# Patient Record
Sex: Female | Born: 1970 | Hispanic: No | Marital: Married | Smoking: Never smoker
Health system: Southern US, Community
[De-identification: ages and names within clinical notes are randomized; demographics above are authoritative.]

## PROBLEM LIST (undated history)

## (undated) DIAGNOSIS — D649 Anemia, unspecified: Secondary | ICD-10-CM

## (undated) DIAGNOSIS — D219 Benign neoplasm of connective and other soft tissue, unspecified: Secondary | ICD-10-CM

## (undated) DIAGNOSIS — S0500XA Injury of conjunctiva and corneal abrasion without foreign body, unspecified eye, initial encounter: Secondary | ICD-10-CM

## (undated) DIAGNOSIS — G5603 Carpal tunnel syndrome, bilateral upper limbs: Secondary | ICD-10-CM

## (undated) DIAGNOSIS — R140 Abdominal distension (gaseous): Secondary | ICD-10-CM

## (undated) HISTORY — PX: TUBAL LIGATION: SHX77

## (undated) HISTORY — DX: Benign neoplasm of connective and other soft tissue, unspecified: D21.9

## (undated) HISTORY — PX: ABDOMINAL HYSTERECTOMY: SHX81

## (undated) HISTORY — PX: HAND SURGERY: SHX662

---

## 1998-08-13 ENCOUNTER — Other Ambulatory Visit: Admission: RE | Admit: 1998-08-13 | Discharge: 1998-08-13 | Payer: Self-pay | Admitting: Obstetrics and Gynecology

## 1999-02-26 ENCOUNTER — Inpatient Hospital Stay (HOSPITAL_COMMUNITY): Admission: AD | Admit: 1999-02-26 | Discharge: 1999-02-27 | Payer: Self-pay | Admitting: *Deleted

## 1999-02-26 ENCOUNTER — Encounter (INDEPENDENT_AMBULATORY_CARE_PROVIDER_SITE_OTHER): Payer: Self-pay

## 1999-03-24 ENCOUNTER — Other Ambulatory Visit: Admission: RE | Admit: 1999-03-24 | Discharge: 1999-03-24 | Payer: Self-pay | Admitting: Obstetrics and Gynecology

## 2000-05-03 ENCOUNTER — Other Ambulatory Visit: Admission: RE | Admit: 2000-05-03 | Discharge: 2000-05-03 | Payer: Self-pay | Admitting: Obstetrics and Gynecology

## 2002-02-14 ENCOUNTER — Encounter: Payer: Self-pay | Admitting: Obstetrics & Gynecology

## 2002-02-14 ENCOUNTER — Encounter: Admission: RE | Admit: 2002-02-14 | Discharge: 2002-02-14 | Payer: Self-pay | Admitting: Obstetrics & Gynecology

## 2002-10-31 ENCOUNTER — Encounter: Admission: RE | Admit: 2002-10-31 | Discharge: 2002-10-31 | Payer: Self-pay | Admitting: Obstetrics and Gynecology

## 2002-10-31 ENCOUNTER — Encounter: Payer: Self-pay | Admitting: Obstetrics and Gynecology

## 2003-03-23 ENCOUNTER — Other Ambulatory Visit: Admission: RE | Admit: 2003-03-23 | Discharge: 2003-03-23 | Payer: Self-pay | Admitting: Obstetrics & Gynecology

## 2003-04-03 ENCOUNTER — Encounter: Admission: RE | Admit: 2003-04-03 | Discharge: 2003-04-03 | Payer: Self-pay | Admitting: Obstetrics & Gynecology

## 2005-07-27 ENCOUNTER — Encounter: Admission: RE | Admit: 2005-07-27 | Discharge: 2005-07-27 | Payer: Self-pay | Admitting: Internal Medicine

## 2005-08-24 ENCOUNTER — Encounter: Admission: RE | Admit: 2005-08-24 | Discharge: 2005-08-24 | Payer: Self-pay | Admitting: Internal Medicine

## 2005-10-03 ENCOUNTER — Encounter: Admission: RE | Admit: 2005-10-03 | Discharge: 2005-10-03 | Payer: Self-pay | Admitting: Internal Medicine

## 2006-08-24 ENCOUNTER — Ambulatory Visit (HOSPITAL_COMMUNITY): Admission: RE | Admit: 2006-08-24 | Discharge: 2006-08-24 | Payer: Self-pay | Admitting: Urology

## 2006-10-08 ENCOUNTER — Encounter (INDEPENDENT_AMBULATORY_CARE_PROVIDER_SITE_OTHER): Payer: Self-pay | Admitting: Urology

## 2006-10-08 ENCOUNTER — Ambulatory Visit (HOSPITAL_BASED_OUTPATIENT_CLINIC_OR_DEPARTMENT_OTHER): Admission: RE | Admit: 2006-10-08 | Discharge: 2006-10-08 | Payer: Self-pay | Admitting: Urology

## 2007-01-27 ENCOUNTER — Ambulatory Visit (HOSPITAL_COMMUNITY): Admission: RE | Admit: 2007-01-27 | Discharge: 2007-01-27 | Payer: Self-pay | Admitting: Urology

## 2010-04-03 ENCOUNTER — Encounter: Payer: Self-pay | Admitting: Internal Medicine

## 2010-07-26 NOTE — Op Note (Signed)
Sonya Gutierrez, Sonya Gutierrez                ACCOUNT NO.:  [id number]   MEDICAL RECORD NO.:  [id number]          PATIENT TYPE:  AMB   LOCATION:  [facility]                         FACILITY:  [facility]   PHYSICIAN:  Sigmund I. Patsi Sears, M.D.DATE OF BIRTH:  Jun 21, 1970   DATE OF PROCEDURE:  10/08/2006  DATE OF DISCHARGE:                               OPERATIVE REPORT   PREOPERATIVE DIAGNOSIS:  Urethral diverticulum.   POSTOPERATIVE DIAGNOSIS:  Infected urethral diverticulum.   OPERATION:  Cystourethroscopy with eccentric right urethral  diverticulectomy, with cultures of urethral pus.   SURGEON:  Sigmund I. Patsi Sears, M.D.   ANESTHESIA:  General LMA.   PREPARATION:  After appropriate preanesthesia, the patient is brought to  the operating room, placed on the operating table in the dorsal supine  position where general LMA anesthesia was induced.  The patient was then  replaced in dorsal lithotomy position where pubis was prepped with  Betadine solution and draped in the usual fashion.   REVIEW OF HISTORY:  This 40 year old Spanish-speaking only female was  referred for evaluation of recurrent urinary tract infections, with a  suggestion of urethral diverticulum present.  MRI documented the  eccentric right-sided diverticulum, and the diverticula appeared to  contain some concretions.  She is now for diverticulectomy.   PROCEDURE:  Cystourethroscopy again revealed some mild granular  trigonitis with normal trigone, and clear reflux of both orifices.  The  urethra itself appears normal, and I do not see the diverticular opening  in the urethra.  The diverticulum itself appears to be very distal, from  the mid urethra to the very distal urethra.   Indigo carmine is injected in the urethra in hopes of pushing some  indigo carmine into the diverticulum.   The diverticulum itself appeared to be approximately 1 cm in diameter,  and presented as a hard, pea-sized nodule, beginning in the midline,  and  then accentuating in the spherical manner on the right side of the  urethra, and proximal ward.   It was elected to perform an inverted U and the inverted U incision, so  that I would have good the have good  vaginal covered tissue.  After  injection with Marcaine 0.25 plain in the periurethral tissues, a U-  shaped incision was made across the distal third of the diverticulum,  and then proximalward to the mid vagina.  Subcutaneous tissue was  dissected.  A tongue was developed, and beneath this, the diverticulum  was identified.  The diverticulum was carefully dissected with both  sharp and blunt dissection.  It appeared to have a very narrow neck at  the level of the mid urethra, and part of the diverticulum extended  proximally.  This gave the diverticulum a tear shape.  It was completely  excised, sent to laboratory for evaluation.  Pus was noted to come from  the diverticulum, and this was cultured, both anaerobic and aerobic  cultures.  The patient was covered with IV Ancef for the procedure.   Cystoscopy again was accomplished, again I could not find the neck of  the diverticulum.  There was no  blue contrast, though there was a  suggestion of a small pinhole urethra of leakage with urethral fluid.  The bladder was drained of fluid with the Foley catheter.  It was  elected to leave  Was elected to leave a Foley catheter in place and  cover the urethra with the previously dissected.  Antibiotic irrigation  was used in copious amounts.  The incision was closed in two layers, by  closing an internal layer with 3-0 Monocryl suture, interrupted and  running, and the vaginal epithelium was closed with two separate running  Monocryl sutures from the 12 o'clock to 3 o'clock position, and from the  12 o'clock to the 9 o'clock positions in the vagina.  The patient  tolerated the procedure well.  No bleeding was noted and the vaginal  packing was placed without difficulty.  The patient  will stay for 23  hours, vaginal packing removed in the a.m.Lynelle Smoke I. Patsi Sears, M.D.  Electronically Signed     SIT/MEDQ  D:  10/08/2006  T:  10/09/2006  Job:  [id number]

## 2010-07-29 NOTE — Discharge Summary (Signed)
[facility]  Patient:    Sonya Gutierrez                          MRN: [id number] Adm. Date:  [id number] Disc. Date: [id number] Attending:  Ardeen Fillers                           Discharge Summary  DISCHARGE DIAGNOSES:          1. Intrauterine pregnancy at term, delivered.                               2. Rh positive.                               3. Group B Strep positive.                               4. Request for sterilization.  PROCEDURE:                    Vacuum-assisted vaginal delivery, repair of second degree perineal laceration on February 26, 1999, and modified Pomeroy bilateral  tubal ligation on February 26, 1999.  HISTORY OF PRESENT ILLNESS:   A 40 year old Hispanic woman, gravida 4, para 3, DC February 24, 1999, by dates and midsecond trimester ultrasound, admitted from maternity admissions unit in active labor at 8 cm.  HOSPITAL COURSE:              Membranes ruptured spontaneously.  The patient was given penicillin-G prophylaxis because of known Group B Strep positive vaginal culture, late in the third trimester.  Second stage was nine minutes.  The patient was consented for vacuum delivery because of bradycardia.  Vacuum was applied at +3 station.  The baby was in OA position and the baby was delivered in two contractions.  A live female, 3660 grams, with Apgars of 8 and 9 over an intact  perineum was delivered.  Estimated blood loss was 400 cc. Second degree perineal laceration was repaired without difficulty.  Arterial cord pH was 7.24.  The patient had requested a postpartum tubal.  This was well documented in the chart.  She had been consented preoperatively and consent was again given.  She was given literature in Spanish as well.  She was taken to the operating room where she underwent an uneventful modified Pomeroy tubal ligation.  Pathology late revealed complete transections of portions of each tube.  The  remainder of the postpartum course was unremarkable.  She was discharged to  home on postpartum day #1 in satisfactory condition.  Hemoglobin stabilized at 12.0.  Routine status post vaginal delivery instructions were given.  DISCHARGE MEDICATIONS:        1. Prenatal vitamins.                               2. Tylox tablets.  FOLLOW-UP:                    She will be followed up by the [facility] in four to six weeks. DD:  04/02/99 TD:  04/05/99 Job: [id number]

## 2010-12-26 LAB — ANAEROBIC CULTURE: Gram Stain: NONE SEEN

## 2010-12-26 LAB — WOUND CULTURE: Gram Stain: NONE SEEN

## 2013-05-14 ENCOUNTER — Ambulatory Visit: Payer: Self-pay

## 2013-05-17 ENCOUNTER — Encounter (HOSPITAL_COMMUNITY): Payer: Self-pay | Admitting: Emergency Medicine

## 2013-05-17 ENCOUNTER — Emergency Department (HOSPITAL_COMMUNITY)
Admission: EM | Admit: 2013-05-17 | Discharge: 2013-05-17 | Disposition: A | Discharge status: Home / Self Care | Payer: No Typology Code available for payment source | Attending: Emergency Medicine | Admitting: Emergency Medicine

## 2013-05-17 DIAGNOSIS — S058X9A Other injuries of unspecified eye and orbit, initial encounter: Secondary | ICD-10-CM | POA: Insufficient documentation

## 2013-05-17 DIAGNOSIS — IMO0002 Reserved for concepts with insufficient information to code with codable children: Secondary | ICD-10-CM | POA: Insufficient documentation

## 2013-05-17 DIAGNOSIS — Y9389 Activity, other specified: Secondary | ICD-10-CM | POA: Insufficient documentation

## 2013-05-17 DIAGNOSIS — S0500XA Injury of conjunctiva and corneal abrasion without foreign body, unspecified eye, initial encounter: Secondary | ICD-10-CM

## 2013-05-17 DIAGNOSIS — Y929 Unspecified place or not applicable: Secondary | ICD-10-CM | POA: Insufficient documentation

## 2013-05-17 MED ORDER — HYDROCODONE-ACETAMINOPHEN 5-325 MG PO TABS
2.0000 | ORAL_TABLET | Freq: Once | ORAL | Status: AC
  Administered 2013-05-17: 2 via ORAL
  Filled 2013-05-17: qty 2

## 2013-05-17 MED ORDER — POLYMYXIN B-TRIMETHOPRIM 10000-0.1 UNIT/ML-% OP SOLN: 2.0000 [drp] | OPHTHALMIC | Status: DC

## 2013-05-17 MED ORDER — TETRACAINE HCL 0.5 % OP SOLN
1.0000 [drp] | Freq: Once | OPHTHALMIC | Status: AC
  Administered 2013-05-17: 1 [drp] via OPHTHALMIC
  Filled 2013-05-17: qty 2

## 2013-05-17 MED ORDER — FLUORESCEIN SODIUM 1 MG OP STRP
1.0000 | ORAL_STRIP | Freq: Once | OPHTHALMIC | Status: AC
  Administered 2013-05-17: 1 via OPHTHALMIC
  Filled 2013-05-17: qty 1

## 2013-05-17 MED ORDER — HYDROCODONE-ACETAMINOPHEN 5-325 MG PO TABS: 2.0000 | ORAL_TABLET | ORAL | Status: DC | PRN

## 2013-05-17 NOTE — ED Notes (Signed)
Pt presents to department for evaluation of L eye pain. States she was using cosmetic tweezers when they slipped and struck eye yesterday. Now states pain and blurred vision. Pupils equal and reactive bilaterally. Pt is alert and oriented x4.

## 2013-05-17 NOTE — ED Provider Notes (Signed)
CSN: [id number]     Arrival date & time 05/17/13  0708 History   First MD Initiated Contact with Patient 05/17/13 [phone]     Chief Complaint  Patient presents with  . Eye Pain     (Consider location/radiation/quality/duration/timing/severity/associated sxs/prior Treatment) HPI Comments: Patient is a 43 year old female who presents with left eye pain that started last night. Patient reports tweezing her eyebrows when the tweezers slipped out of her hand and his hier left eyeball. Patient reports sudden onset of sharp pain that does not radiate. Patient reports associated watery drainage and photophobia. Patient was unable to sleep last night due to the pain. No alleviating factors. Patient's husband reports getting her eye drops and eye wash from the store which provided no relief. Patient denies any other injury.    History reviewed. No pertinent past medical history. History reviewed. No pertinent past surgical history. No family history on file. History  Substance Use Topics  . Smoking status: Never Smoker   . Smokeless tobacco: Not on file  . Alcohol Use: No   OB History   Grav Para Term Preterm Abortions TAB SAB Ect Mult Living                 Review of Systems  Constitutional: Negative for fever, chills and fatigue.  HENT: Negative for trouble swallowing.   Eyes: Positive for pain and visual disturbance.  Respiratory: Negative for shortness of breath.   Cardiovascular: Negative for chest pain and palpitations.  Gastrointestinal: Negative for nausea, vomiting, abdominal pain and diarrhea.  Genitourinary: Negative for dysuria and difficulty urinating.  Musculoskeletal: Negative for arthralgias and neck pain.  Skin: Negative for color change.  Neurological: Negative for dizziness and weakness.  Psychiatric/Behavioral: Negative for dysphoric mood.      Allergies  Review of patient's allergies indicates no known allergies.  Home Medications  No current outpatient  prescriptions on file. BP 115/65  Pulse 98  Temp(Src) 98.2 F (36.8 C) (Oral)  Resp 20  SpO2 99% Physical Exam  Nursing note and vitals reviewed. Constitutional: She is oriented to person, place, and time. She appears well-developed and well-nourished. No distress.  HENT:  Head: Normocephalic and atraumatic.  Eyes: Conjunctivae and EOM are normal. Pupils are equal, round, and reactive to light.    Watery discharge noted of left eye. Corneal abrasion noted over left iris and pupil.   Neck: Normal range of motion.  Cardiovascular: Normal rate and regular rhythm.  Exam reveals no gallop and no friction rub.   No murmur heard. Pulmonary/Chest: Effort normal and breath sounds normal. She has no wheezes. She has no rales. She exhibits no tenderness.  Abdominal: Soft. There is no tenderness.  Musculoskeletal: Normal range of motion.  Neurological: She is alert and oriented to person, place, and time. Coordination normal.  Speech is goal-oriented. Moves limbs without ataxia.   Skin: Skin is warm and dry.  Psychiatric: She has a normal mood and affect. Her behavior is normal.    ED Course  Procedures (including critical care time) Labs Review Labs Reviewed - No data to display Imaging Review No results found.   EKG Interpretation None      MDM   Final diagnoses:  Corneal abrasion    7:51 AM Patient has a corneal abrasion overlying her left pupil diagnosed with fluorescin dye and woods lamp exam. Vitals stable and patient afebrile. Patient given vicodin for pain. Patient will be discharged with Vicodin and polytrim drops. Left eye visual acuity  is better than her right eye based on results.     Alvina Chou, [place] 05/17/13 [phone]

## 2013-05-17 NOTE — ED Provider Notes (Signed)
Medical screening examination/treatment/procedure(s) were performed by non-physician practitioner and as supervising physician I was immediately available for consultation/collaboration.   EKG Interpretation None        Mariea Clonts, MD 05/17/13 [phone]

## 2013-05-17 NOTE — Discharge Instructions (Signed)
Take Vicodin for pain. Use Polytrim drops in the left eye until symptoms resolve. Return to the Emergency Department with worsening or concerning symptoms.

## 2013-07-26 ENCOUNTER — Encounter (HOSPITAL_COMMUNITY): Payer: Self-pay | Admitting: Emergency Medicine

## 2013-07-26 ENCOUNTER — Emergency Department (HOSPITAL_COMMUNITY)
Admission: EM | Admit: 2013-07-26 | Discharge: 2013-07-26 | Disposition: A | Discharge status: Home / Self Care | Payer: No Typology Code available for payment source | Attending: Emergency Medicine | Admitting: Emergency Medicine

## 2013-07-26 DIAGNOSIS — X58XXXA Exposure to other specified factors, initial encounter: Secondary | ICD-10-CM | POA: Insufficient documentation

## 2013-07-26 DIAGNOSIS — Y929 Unspecified place or not applicable: Secondary | ICD-10-CM | POA: Insufficient documentation

## 2013-07-26 DIAGNOSIS — S058X9A Other injuries of unspecified eye and orbit, initial encounter: Secondary | ICD-10-CM | POA: Insufficient documentation

## 2013-07-26 DIAGNOSIS — Y939 Activity, unspecified: Secondary | ICD-10-CM | POA: Insufficient documentation

## 2013-07-26 DIAGNOSIS — S0500XA Injury of conjunctiva and corneal abrasion without foreign body, unspecified eye, initial encounter: Secondary | ICD-10-CM

## 2013-07-26 HISTORY — DX: Injury of conjunctiva and corneal abrasion without foreign body, unspecified eye, initial encounter: S05.00XA

## 2013-07-26 LAB — CBG MONITORING, ED: GLUCOSE-CAPILLARY: 92 mg/dL (ref 70–99)

## 2013-07-26 MED ORDER — ERYTHROMYCIN 5 MG/GM OP OINT: TOPICAL_OINTMENT | OPHTHALMIC | Status: DC

## 2013-07-26 MED ORDER — FLUORESCEIN SODIUM 1 MG OP STRP
1.0000 | ORAL_STRIP | Freq: Once | OPHTHALMIC | Status: AC
  Administered 2013-07-26: 1 via OPHTHALMIC
  Filled 2013-07-26: qty 1

## 2013-07-26 MED ORDER — HYDROCODONE-ACETAMINOPHEN 5-325 MG PO TABS: 1.0000 | ORAL_TABLET | ORAL | Status: DC | PRN

## 2013-07-26 MED ORDER — TETRACAINE HCL 0.5 % OP SOLN
1.0000 [drp] | Freq: Once | OPHTHALMIC | Status: AC
  Administered 2013-07-26: 1 [drp] via OPHTHALMIC
  Filled 2013-07-26: qty 2

## 2013-07-26 NOTE — Discharge Instructions (Signed)
Abrasión corneal  (Corneal Abrasion)  La córnea es la cubierta transparente en la parte anterior y central del ojo. Cuando mira la parte colorida del ojo (iris), está mirando a través de la córnea. La córnea es un tejido muy delgado formado por varias capas. La capa superficial es una capa única de células (epitelio corneal) y es uno de los tejidos más sensibles del organismo. Si un rasguño o una lesión hace que el epitelio corneal se desprenda, a esto se lo llama abrasión corneal. Si la lesión se extiende a los tejidos que se encuentran por debajo del epitelio, la afección se denomina úlcera corneal.  CAUSAS   · Rasguños.  · Traumatismos.  · Cuerpo extraño en el ojo.  Algunas personas tienen recurrencias de abrasiones en la zona de la lesión original incluso después de que esta ha sanado (síndrome de erosión recurrente). El síndrome de erosión recurrente generalmente mejora y desaparece con el tiempo.  SÍNTOMAS   · Dolor en el ojo.  · Dificultad o imposibilidad de mantener abierto el ojo lesionado.  · El ojo está muy sensible a la luz.  · Las erosiones recurrentes tienden a ocurrir de manera repentina, lo primero que sucede en la mañana, generalmente al despertar y abrir los ojos.  DIAGNÓSTICO   Su médico podrá diagnosticar una abrasión corneal durante un examen ocular. Generalmente se coloca un tinte en el ojo, usando un gotero o una pequeña tira de papel humedecida con sus lágrimas. Al examinar el ojo con una luz especial, aparece claramente la abrasión destacada por el tinte.  TRATAMIENTO   · Las abrasiones pequeñas pueden tratarse con gotas o un ungüento con antibiótico.  · Por lo general, se aplica especialmente un parche de presión. Los parches de presión evitan el parpadeo y permiten la curación del epitelio corneal. Los parches de presión también reducen el dolor presente durante la curación del ojo. La mayoría de las abrasiones corneales se cura dentro de los 2 a 3 días y no afecta la visión.  Si la  abrasión se infecta y se disemina hacia tejidos más profundos de la córnea, puede producirse una úlcera corneal. Esto es más grave porque puede causar una cicatriz en la córnea. Las cicatrices en la córnea interfieren con el paso de la luz a través de esta y causan pérdida de la visión en el ojo involucrado.  INSTRUCCIONES PARA EL CUIDADO EN EL HOGAR  · Utilice los medicamentos o el ungüento según lo indicado. Utilice los medicamentos de venta libre o recetados para calmar el dolor, el malestar o la fiebre, según se lo indique el médico.  · No conduzca ni opere maquinarias mientras tenga el parche en el ojo. En estas condiciones no puede juzgar correctamente las distancias.  · Si el médico le ha dado fecha para una visita de control, es importante que concurra. No cumplir con las visitas de control puede dar como resultado una infección grave en el ojo o una pérdida permanente de la visión. Si hay algún problema que le impide acudir a la cita, avísele a su médico.  SOLICITE ATENCIÓN MÉDICA SI:   · Siente dolor, tiene sensibilidad a la luz y experimenta una sensación de picazón en un ojo o en ambos.  · El parche de presión se afloja continuamente y puede parpadear debajo del parche después del tratamiento.  · Aparece algún tipo de secreción en el ojo después del tratamiento o los párpados están pegados por la mañana.  · Siente por la mañana los mismos   síntomas que sintió en los días en que tuvo la abrasión original, algunas semanas o meses después de la curación.  ASEGÚRESE DE QUE:   · Comprende estas instrucciones.  · Controlará su afección.  · Recibirá ayuda de inmediato si no mejora o si empeora.  Document Released: 02/27/2005 Document Revised: 12/18/2012  [employer]® Patient Information ©2014 [employer].

## 2013-07-26 NOTE — ED Provider Notes (Signed)
CSN: [id number]     Arrival date & time 07/26/13  0507 History   First MD Initiated Contact with Patient 07/26/13 0602     Chief Complaint  Patient presents with  . Eye Pain     (Consider location/radiation/quality/duration/timing/severity/associated sxs/prior Treatment) HPI  Patient to the ED with complaints of left eye pain. It has been intermittent since May 17, 2013. She scratched her eye on something, came to the ER and was diagnosed with corneal abrasion and given Polytrim and Vicodin for pain. She admits that it did start to get better until a few days but never completely went away and then 2 days ago she felt like something was in her eye and she developed pain again that has been worsening. She feels as though she has something in her eye. She reports sensitivity to light, No change of vision, and clear watery discharge. No redness to the eye. No pain to the touch of the eye. She denies being a diabetic or having a history of Glaucoma. Normal BP. Denies trauma   Past Medical History  Diagnosis Date  . Corneal abrasion    History reviewed. No pertinent past surgical history. No family history on file. History  Substance Use Topics  . Smoking status: Never Smoker   . Smokeless tobacco: Not on file  . Alcohol Use: No   OB History   Grav Para Term Preterm Abortions TAB SAB Ect Mult Living                 Review of Systems   Review of Systems  Gen: no weight loss, fevers, chills, night sweats  Eyes: no discharge or drainage or visual changes + occular pain and discharge Nose: no epistaxis or rhinorrhea  Mouth: no dental pain, no sore throat  Neck: no neck pain  Lungs:No wheezing, coughing or hemoptysis CV: no chest pain, palpitations, dependent edema or orthopnea  Abd: no abdominal pain, nausea, vomiting, diarrhea GU: no dysuria or gross hematuria  MSK:  No muscle weakness or pain Neuro: no headache, no focal neurologic deficits  Skin: no rash or wounds Psyche: no  complaints    Allergies  Review of patient's allergies indicates no known allergies.  Home Medications   Prior to Admission medications   Medication Sig Start Date End Date Taking? Authorizing Provider  butalbital-acetaminophen-caffeine (FIORICET WITH CODEINE) 50-325-40-30 MG per capsule Take 1 capsule by mouth 2 (two) times daily as needed for headache.   Yes Historical Provider, MD  naproxen sodium (ANAPROX) 220 MG tablet Take 220 mg by mouth daily as needed (Pain).   Yes Historical Provider, MD  White Petrolatum-Mineral Oil (GENTEAL PM OP) Place 1 application into the left eye at bedtime as needed (pain).   Yes Historical Provider, MD   BP 116/72  Pulse 83  Temp(Src) 97.7 F (36.5 C)  Resp 20  SpO2 100% Physical Exam  Nursing note and vitals reviewed. Constitutional: She appears well-developed and well-nourished. No distress.  HENT:  Head: Normocephalic and atraumatic.  Eyes: Conjunctivae and EOM are normal. Pupils are equal, round, and reactive to light. Right eye exhibits no discharge. Left eye exhibits discharge (clear watery).    Corneal abrasion in similar place as two months ago. No sign of globe rupture and no redness, induration or swelling noted. NO tenderness to palpation of globe. EOMI's are intact, pupils equal round reactive, no sensitivity to light after Tetracaine drops.  Neck: Normal range of motion. Neck supple.  Cardiovascular: Normal rate and regular  rhythm.   Pulmonary/Chest: Effort normal.  Abdominal: Soft.  Neurological: She is alert.  Skin: Skin is warm and dry.    ED Course  Procedures (including critical care time) Labs Review Labs Reviewed  CBG MONITORING, ED    Imaging Review No results found.   EKG Interpretation None      MDM   Final diagnoses:  None    Recurrent corneal abrasions, I asked patient what she does for work or hobby and suggested she wear eye protection as she has a corneal abrasion near the same spot as last  time. It is difficult to say if this is new or chronic. I stressed the importance of following up with the eye specialist on Monday since this is unusual. She voices understanding and agrees.   I flipped eye lids and did not see any foreign bodies. Eye irrigated with 500 mL sterile saline. Patient reports relief that "nothing is in my eye anymore".  Rx erythromycin ointment and Vicodin.  43 y.o.Sonya Gutierrez's evaluation in the Emergency Department is complete. It has been determined that no acute conditions requiring further emergency intervention are present at this time. The patient/guardian have been advised of the diagnosis and plan. We have discussed signs and symptoms that warrant return to the ED, such as changes or worsening in symptoms.  Vital signs are stable at discharge. Filed Vitals:   07/26/13 0510  BP: 116/72  Pulse: 83  Temp: 97.7 F (36.5 C)  Resp: 20    Patient/guardian has voiced understanding and agreed to follow-up with the PCP or specialist.     Linus Mako, PA-C 07/26/13 0720

## 2013-07-26 NOTE — ED Notes (Signed)
CBG Taken =  94

## 2013-07-26 NOTE — ED Notes (Signed)
Pt. reports left eye pain with reddness and drainage onset last week unrelieved by prescription eye ointment .

## 2013-07-26 NOTE — ED Notes (Signed)
Hooked Morgan Lens up to 570mL bag to irrigate patient's left eye.

## 2013-07-26 NOTE — ED Notes (Signed)
Pt finished irrigating with 530mL bag. Pt states that she no longer feels like something is in her eye. Pt still has pain.

## 2013-07-26 NOTE — ED Notes (Addendum)
Pt states that 2 weeks ago she had eye pain was seen and given eye drops. Pt states she used the drops and then they ran out and her pain came back. Pt states drainage as well. Pt feels like she has something on her eye.

## 2013-07-26 NOTE — ED Provider Notes (Signed)
Medical screening examination/treatment/procedure(s) were performed by non-physician practitioner and as supervising physician I was immediately available for consultation/collaboration.   EKG Interpretation None        Ephraim Hamburger, MD 07/26/13 [phone]

## 2013-09-04 ENCOUNTER — Telehealth: Payer: Self-pay

## 2013-09-04 ENCOUNTER — Ambulatory Visit (INDEPENDENT_AMBULATORY_CARE_PROVIDER_SITE_OTHER): Payer: No Typology Code available for payment source | Admitting: Family Medicine

## 2013-09-04 ENCOUNTER — Encounter: Payer: Self-pay | Admitting: Family Medicine

## 2013-09-04 VITALS — BP 119/73 | HR 79 | Temp 98.2°F | Resp 20 | Ht 60.0 in | Wt 155.0 lb

## 2013-09-04 DIAGNOSIS — Z23 Encounter for immunization: Secondary | ICD-10-CM

## 2013-09-04 DIAGNOSIS — Z131 Encounter for screening for diabetes mellitus: Secondary | ICD-10-CM

## 2013-09-04 DIAGNOSIS — R51 Headache: Secondary | ICD-10-CM

## 2013-09-04 DIAGNOSIS — Z789 Other specified health status: Secondary | ICD-10-CM

## 2013-09-04 DIAGNOSIS — R5383 Other fatigue: Secondary | ICD-10-CM

## 2013-09-04 DIAGNOSIS — R5381 Other malaise: Secondary | ICD-10-CM

## 2013-09-04 DIAGNOSIS — R519 Headache, unspecified: Secondary | ICD-10-CM

## 2013-09-04 DIAGNOSIS — R635 Abnormal weight gain: Secondary | ICD-10-CM

## 2013-09-04 LAB — CBC WITH DIFFERENTIAL/PLATELET
BASOS ABS: 0 10*3/uL (ref 0.0–0.1)
BASOS PCT: 0 % (ref 0–1)
EOS PCT: 3 % (ref 0–5)
Eosinophils Absolute: 0.2 10*3/uL (ref 0.0–0.7)
HCT: 39.2 % (ref 36.0–46.0)
HEMOGLOBIN: 12.7 g/dL (ref 12.0–15.0)
LYMPHS ABS: 1.7 10*3/uL (ref 0.7–4.0)
LYMPHS PCT: 25 % (ref 12–46)
MCH: 25 pg — AB (ref 26.0–34.0)
MCHC: 32.4 g/dL (ref 30.0–36.0)
MCV: 77.3 fL — AB (ref 78.0–100.0)
MONOS PCT: 9 % (ref 3–12)
Monocytes Absolute: 0.6 10*3/uL (ref 0.1–1.0)
Neutro Abs: 4.3 10*3/uL (ref 1.7–7.7)
Neutrophils Relative %: 63 % (ref 43–77)
PLATELETS: 258 10*3/uL (ref 150–400)
RBC: 5.07 MIL/uL (ref 3.87–5.11)
RDW: 18.4 % — ABNORMAL HIGH (ref 11.5–15.5)
WBC: 6.8 10*3/uL (ref 4.0–10.5)

## 2013-09-04 LAB — BASIC METABOLIC PANEL
BUN: 11 mg/dL (ref 6–23)
CALCIUM: 9.4 mg/dL (ref 8.4–10.5)
CHLORIDE: 101 meq/L (ref 96–112)
CO2: 28 meq/L (ref 19–32)
Creat: 0.59 mg/dL (ref 0.50–1.10)
Glucose, Bld: 81 mg/dL (ref 70–99)
Potassium: 4 mEq/L (ref 3.5–5.3)
SODIUM: 135 meq/L (ref 135–145)

## 2013-09-04 LAB — HEMOGLOBIN A1C
Hgb A1c MFr Bld: 5 % (ref <5.7)
Mean Plasma Glucose: 97 mg/dL (ref <117)

## 2013-09-04 NOTE — Telephone Encounter (Signed)
Pt's referral has gone over to [facility] Referral Form([place]) Pt will be notified when referral has been recieved with date and time

## 2013-09-04 NOTE — Progress Notes (Signed)
Subjective:    Patient ID: Sonya Gutierrez, female    DOB: 12-07-1970, 43 y.o.   MRN: [id number]  HPI Patient is in office to establish care. Patient has lived in [place] for 18 years. She states that she relocated from [place]. Patient understands English, but primarily speaks [place]. She is accompanied by an interpreter.   Patient complaining of intermittent headaches after sustaining a corneal abrasion to left eye in March 2015. She states that she was plucking her eyebrows and accidentally scratched her eye with tweezers. Patient was evaluated by the emergency department on 2 separate occasions. She was prescribed Erythromycin ointment and vicodin for pain. She states that symptoms improved with erythromycin ointment. She states that she was afraid to take the vicodin. Patient was referred to an opthalmologist. She is scheduled for evaluation. She denies blurred vision, dizziness, weakness, gait abnormality, nausea, vomiting, or diarrhea.   Patient complaining of fatigue for the past month. She state that she is tired despite getting 5-6 hours of sleep per night. She denies a history of diabetes or hypertension. Patient reports that she has gain greater than 20 pounds in the past 2-3 months.    Review of Systems  Constitutional: Positive for fatigue and unexpected weight change (weight gain).  Eyes: Positive for photophobia and pain. Negative for discharge and itching.  Respiratory: Negative.   Cardiovascular: Negative.   Endocrine: Negative.   Musculoskeletal: Negative.   Allergic/Immunologic: Negative.   Neurological: Positive for headaches. Negative for dizziness, syncope, speech difficulty and weakness.  Psychiatric/Behavioral: Negative.        Objective:   Physical Exam  Constitutional: She is oriented to person, place, and time. She appears well-developed and well-nourished.  HENT:  Head: Normocephalic and atraumatic.  Eyes: Conjunctivae and EOM are normal. Pupils are  equal, round, and reactive to light.  Neck: Normal range of motion. Neck supple.  Cardiovascular: Normal rate, regular rhythm and normal heart sounds.   Pulmonary/Chest: Effort normal and breath sounds normal.  Abdominal: Soft. Bowel sounds are normal.  Musculoskeletal: Normal range of motion.  Neurological: She is alert and oriented to person, place, and time. She has normal reflexes.  Skin: Skin is warm and dry.  Psychiatric: She has a normal mood and affect. Her behavior is normal. Judgment and thought content normal.         BP 119/73  Pulse 79  Temp(Src) 98.2 F (36.8 C) (Oral)  Resp 20  Ht 5' (1.524 m)  Wt 155 lb (70.308 kg)  BMI 30.27 kg/m2  LMP 07/18/2013 Assessment & Plan:  1. Eye infection: Patient was plucking her eyebrows and stabbed herself in the eye. Reports that she was referred to opthalmology by the emergency department. Prescribed erythromycin ointment, states that eye has improved. Patient to follow up with opthalmologist as scheduled.   2. Headaches- Headache was relieved by OTC NSAID this am. Recommend that patient rest in dark, quiet room at the onset of a headache. Follow up with opthalmologist.   3. Fatigue: Patient reports that she has been fatigued for 1 month despite 6-8 hours of sleep. She denies weakness, shortness of breath swelling to upper and lower extremities. Will check HbA1C, BMP, CBC,  and TSH.   4. Weight gain: She states that she has gained 20 pounds or greater over the past 2 months. She states that she has not changed eating, but she does not exercise or drink water.  Will check TSH and HbA1C      Vaccination:  Tdap received without complication Last pap smear: Last year, normal per patient. 4 children 30, 28,20, 14 Mammogram: Send for mammogram Last dental visits: Last dental visit was last year in [place]  Non smoker/non drinker.   RTC: 3 months for complete physical examination

## 2013-09-04 NOTE — Patient Instructions (Signed)
Start 1800 calorie lowfat, low carbohydrate diet divided over 6 small meals Increase water intake to 6-8 glasses Exercise 3-5 times per week for 30 minutes

## 2013-09-04 NOTE — Telephone Encounter (Signed)
Pt's info was faxed over to [facility]) Pt will be contacted when Appointment becomes Avb.

## 2013-09-05 LAB — URINALYSIS, COMPLETE
BILIRUBIN URINE: NEGATIVE
Bacteria, UA: NONE SEEN
Casts: NONE SEEN
Crystals: NONE SEEN
GLUCOSE, UA: NEGATIVE mg/dL
HGB URINE DIPSTICK: NEGATIVE
Ketones, ur: NEGATIVE mg/dL
Leukocytes, UA: NEGATIVE
NITRITE: NEGATIVE
PROTEIN: NEGATIVE mg/dL
Specific Gravity, Urine: 1.02 (ref 1.005–1.030)
UROBILINOGEN UA: 0.2 mg/dL (ref 0.0–1.0)
pH: 6 (ref 5.0–8.0)

## 2013-09-05 LAB — TSH: TSH: 1.597 u[IU]/mL (ref 0.350–4.500)

## 2013-09-10 DIAGNOSIS — Z23 Encounter for immunization: Secondary | ICD-10-CM | POA: Insufficient documentation

## 2013-09-10 DIAGNOSIS — R635 Abnormal weight gain: Secondary | ICD-10-CM | POA: Insufficient documentation

## 2013-09-10 DIAGNOSIS — R5383 Other fatigue: Secondary | ICD-10-CM

## 2013-09-10 DIAGNOSIS — R51 Headache: Secondary | ICD-10-CM

## 2013-09-10 DIAGNOSIS — R5381 Other malaise: Secondary | ICD-10-CM | POA: Insufficient documentation

## 2013-09-10 DIAGNOSIS — Z789 Other specified health status: Secondary | ICD-10-CM | POA: Insufficient documentation

## 2013-09-10 DIAGNOSIS — Z131 Encounter for screening for diabetes mellitus: Secondary | ICD-10-CM | POA: Insufficient documentation

## 2013-09-10 DIAGNOSIS — R519 Headache, unspecified: Secondary | ICD-10-CM | POA: Insufficient documentation

## 2013-09-19 ENCOUNTER — Other Ambulatory Visit (HOSPITAL_COMMUNITY): Payer: Self-pay | Admitting: Geriatric Medicine

## 2013-09-19 DIAGNOSIS — Z1231 Encounter for screening mammogram for malignant neoplasm of breast: Secondary | ICD-10-CM

## 2013-10-01 ENCOUNTER — Ambulatory Visit (HOSPITAL_COMMUNITY)
Admission: RE | Admit: 2013-10-01 | Discharge: 2013-10-01 | Disposition: A | Discharge status: Home / Self Care | Payer: No Typology Code available for payment source | Source: Ambulatory Visit | Attending: Geriatric Medicine | Admitting: Geriatric Medicine

## 2013-10-01 DIAGNOSIS — Z1231 Encounter for screening mammogram for malignant neoplasm of breast: Secondary | ICD-10-CM

## 2013-12-04 ENCOUNTER — Other Ambulatory Visit (HOSPITAL_COMMUNITY)
Admission: RE | Admit: 2013-12-04 | Discharge: 2013-12-04 | Disposition: A | Discharge status: Home / Self Care | Payer: No Typology Code available for payment source | Source: Ambulatory Visit | Attending: Internal Medicine | Admitting: Internal Medicine

## 2013-12-04 ENCOUNTER — Ambulatory Visit (INDEPENDENT_AMBULATORY_CARE_PROVIDER_SITE_OTHER): Payer: No Typology Code available for payment source | Admitting: Internal Medicine

## 2013-12-04 VITALS — BP 118/54 | HR 68 | Temp 98.1°F | Resp 14 | Ht 60.0 in | Wt 153.0 lb

## 2013-12-04 DIAGNOSIS — Z0000 Encounter for general adult medical examination without abnormal findings: Secondary | ICD-10-CM | POA: Insufficient documentation

## 2013-12-04 DIAGNOSIS — Z23 Encounter for immunization: Secondary | ICD-10-CM | POA: Insufficient documentation

## 2013-12-04 DIAGNOSIS — Z113 Encounter for screening for infections with a predominantly sexual mode of transmission: Secondary | ICD-10-CM | POA: Insufficient documentation

## 2013-12-04 NOTE — Progress Notes (Signed)
Patient ID: Sonya Gutierrez, female   DOB: 09/08/1970, 43 y.o.   MRN: [id number]   Sonya Gutierrez, is a 43 y.o. female  [id number]  [id number]  DOB - [birth year]  CC:  Chief Complaint  Patient presents with  . Annual Exam       HPI: Sonya Gutierrez is a 43 y.o. female here today for Annual Physical Examination. She has no current complaints but states that is the last week she had tactile fever and body aches. However the aches are better and she has not had a fever in several days.  With regard to her preventative health, she had a mammogram 1 year ago which was normal and pap more than 1 year ago which was negative., She has had 3 normal consecutive PAP but is sexually active with only her husband. She  Has not had  Period since July but has no vasomotor symptoms of menopause. He mother began menopause at age 58.   She has had no change in bowel or bladder function in the past.   She has not had any HA recently and has had no problems wit her vision since havign the corneal abrasion.   She reports that she feels happy and well adjusted.  Patient has No headache, No chest pain, No abdominal pain - No Nausea, No new weakness tingling or numbness, No Cough - SOB.  No Known Allergies Past Medical History  Diagnosis Date  . Corneal abrasion    Current Outpatient Prescriptions on File Prior to Visit  Medication Sig Dispense Refill  . butalbital-acetaminophen-caffeine (FIORICET WITH CODEINE) 50-325-40-30 MG per capsule Take 1 capsule by mouth 2 (two) times daily as needed for headache.      . erythromycin ophthalmic ointment Place a 1/2 inch ribbon of ointment into the lower eyelid.  1 g  0  . HYDROcodone-acetaminophen (NORCO/VICODIN) 5-325 MG per tablet Take 1-2 tablets by mouth every 4 (four) hours as needed.  20 tablet  0  . naproxen sodium (ANAPROX) 220 MG tablet Take 220 mg by mouth daily as needed (Pain).      Marland Kitchen White Petrolatum-Mineral Oil (GENTEAL PM OP) Place 1 application into  the left eye at bedtime as needed (pain).       No current facility-administered medications on file prior to visit.   No family history on file. History   Social History  . Marital Status: Married    Spouse Name: N/A    Number of Children: N/A  . Years of Education: N/A   Occupational History  . Not on file.   Social History Main Topics  . Smoking status: Never Smoker   . Smokeless tobacco: Not on file  . Alcohol Use: No  . Drug Use: No  . Sexual Activity: Not on file   Other Topics Concern  . Not on file   Social History Narrative  . No narrative on file    Review of Systems: Constitutional: Negative for chills, diaphoresis, activity change, appetite change and fatigue. HENT: Negative for ear pain, nosebleeds, congestion, facial swelling, rhinorrhea, neck pain, neck stiffness and ear discharge.  Eyes: Negative for pain, discharge, redness, itching and visual disturbance. Respiratory: Negative for cough, choking, chest tightness, shortness of breath, wheezing and stridor.  Cardiovascular: Negative for chest pain, palpitations and leg swelling. Gastrointestinal: Negative for abdominal distention. Genitourinary: Negative for dysuria, urgency, frequency, hematuria, flank pain, decreased urine volume, difficulty urinating and dyspareunia.  Musculoskeletal: Negative for back pain, joint swelling, arthralgia and gait problem.  Neurological: Negative for dizziness, tremors, seizures, syncope, facial asymmetry, speech difficulty, weakness, light-headedness, numbness and headaches.  Hematological: Negative for adenopathy. Does not bruise/bleed easily. Psychiatric/Behavioral: Negative for hallucinations, behavioral problems, confusion, dysphoric mood, decreased concentration and agitation.    Objective:    Filed Vitals:   12/04/13 0955  BP: 118/54  Pulse: 68  Temp: 98.1 F (36.7 C)  Resp: 14    Physical Exam: Constitutional: Patient appears well-developed and  well-nourished. No distress. HENT: Normocephalic, atraumatic, External right and left ear normal. Oropharynx is clear and moist.  Eyes: Conjunctivae and EOM are normal. PERRLA, no scleral icterus. Neck: Normal ROM. Neck supple. No JVD. No tracheal deviation. No thyromegaly. CVS: RRR, S1/S2 +, no murmurs, no gallops, no carotid bruit.  Pulmonary: Effort and breath sounds normal, no stridor, rhonchi, wheezes, rales.  Abdominal: Soft. BS +, no distension, tenderness, rebound or guarding.  Genitalia: external genitalia normal and  Musculoskeletal: Normal range of motion. No edema and no tenderness.  Lymphadenopathy: No lymphadenopathy noted, cervical, inguinal or axillary Neuro: Alert. Normal reflexes, muscle tone coordination. No cranial nerve deficit. Skin: Skin is warm and dry. No rash noted. Not diaphoretic. No erythema. No pallor. Psychiatric: Normal mood and affect. Behavior, judgment, thought content normal.  Lab Results  Component Value Date   WBC 6.8 09/04/2013   HGB 12.7 09/04/2013   HCT 39.2 09/04/2013   MCV 77.3* 09/04/2013   PLT 258 09/04/2013   Lab Results  Component Value Date   CREATININE 0.59 09/04/2013   BUN 11 09/04/2013   NA 135 09/04/2013   K 4.0 09/04/2013   CL 101 09/04/2013   CO2 28 09/04/2013    Lab Results  Component Value Date   HGBA1C 5.0 09/04/2013   Lipid Panel  No results found for this basename: chol, trig, hdl, cholhdl, vldl, ldlcalc       Assessment and plan:   1. Annual physical exam - COMPLETE METABOLIC PANEL WITH GFR - Lipid panel - Cytology - PAP - EKG 12-Lead  2. Immunization due - Flu Vaccine QUAD 36+ mos PF IM (Fluarix Quad PF)   Follow-up one year.  The patient was given clear instructions to go to ER or return to medical center if symptoms don't improve, worsen or new problems develop. The patient verbalized understanding. The patient was told to call to get lab results if they haven't heard anything in the next week.     This note  has been created with Surveyor, quantity. Any transcriptional errors are unintentional.    Rockland Kotarski A., MD [place], [place]   12/04/2013, 11:07 AM

## 2013-12-05 LAB — LIPID PANEL
CHOLESTEROL: 210 mg/dL — AB (ref 0–200)
HDL: 58 mg/dL (ref >39)
LDL CALC: 131 mg/dL — AB (ref 0–99)
Total CHOL/HDL Ratio: 3.6 Ratio
Triglycerides: 105 mg/dL (ref <150)
VLDL: 21 mg/dL (ref 0–40)

## 2013-12-05 LAB — COMPLETE METABOLIC PANEL WITH GFR
ALBUMIN: 4.4 g/dL (ref 3.5–5.2)
ALT: 14 U/L (ref 0–35)
AST: 14 U/L (ref 0–37)
Alkaline Phosphatase: 69 U/L (ref 39–117)
BILIRUBIN TOTAL: 0.5 mg/dL (ref 0.2–1.2)
BUN: 9 mg/dL (ref 6–23)
CHLORIDE: 105 meq/L (ref 96–112)
CO2: 20 meq/L (ref 19–32)
Calcium: 9.2 mg/dL (ref 8.4–10.5)
Creat: 0.57 mg/dL (ref 0.50–1.10)
GFR, Est African American: >89 mL/min
GFR, Est Non African American: >89 mL/min
GLUCOSE: 74 mg/dL (ref 70–99)
Potassium: 4 mEq/L (ref 3.5–5.3)
Sodium: 137 mEq/L (ref 135–145)
TOTAL PROTEIN: 7.1 g/dL (ref 6.0–8.3)

## 2013-12-05 NOTE — Progress Notes (Signed)
Patient ID: Sonya Gutierrez, female   DOB: 23-Jan-1971, 43 y.o.   MRN: [id number] Labs reviewed. ASCVD risk calculated and no indication for statin therapy noted.

## 2013-12-10 ENCOUNTER — Other Ambulatory Visit: Payer: Self-pay | Admitting: Internal Medicine

## 2013-12-10 LAB — CYTOLOGY - PAP

## 2013-12-12 LAB — GC/CHLAMYDIA PROBE AMP, GENITAL
Chlamydia, DNA Probe: NOT DETECTED
GC Probe Amp, Genital: NOT DETECTED

## 2013-12-12 LAB — CERVICOVAGINAL ANCILLARY ONLY: HERPES (WINDOWPATH): NEGATIVE

## 2013-12-15 LAB — HERPES SIMPLEX VIRUS CULTURE: ORGANISM ID, BACTERIA: NOT DETECTED

## 2014-04-23 ENCOUNTER — Ambulatory Visit: Payer: No Typology Code available for payment source | Attending: Internal Medicine

## 2014-12-07 ENCOUNTER — Encounter: Payer: No Typology Code available for payment source | Admitting: Family Medicine

## 2015-05-04 ENCOUNTER — Ambulatory Visit (INDEPENDENT_AMBULATORY_CARE_PROVIDER_SITE_OTHER): Payer: No Typology Code available for payment source | Admitting: Family Medicine

## 2015-05-04 ENCOUNTER — Encounter: Payer: Self-pay | Admitting: Family Medicine

## 2015-05-04 VITALS — BP 100/60 | HR 89 | Temp 97.8°F | Resp 14 | Ht 59.0 in | Wt 156.0 lb

## 2015-05-04 DIAGNOSIS — Z87440 Personal history of urinary (tract) infections: Secondary | ICD-10-CM

## 2015-05-04 DIAGNOSIS — R300 Dysuria: Secondary | ICD-10-CM

## 2015-05-04 DIAGNOSIS — Z789 Other specified health status: Secondary | ICD-10-CM

## 2015-05-04 DIAGNOSIS — R319 Hematuria, unspecified: Secondary | ICD-10-CM

## 2015-05-04 LAB — CBC WITH DIFFERENTIAL/PLATELET
Basophils Absolute: 0 10*3/uL (ref 0.0–0.1)
Basophils Relative: 0 % (ref 0–1)
Eosinophils Absolute: 0.2 10*3/uL (ref 0.0–0.7)
Eosinophils Relative: 3 % (ref 0–5)
HEMATOCRIT: 32.1 % — AB (ref 36.0–46.0)
Hemoglobin: 10.3 g/dL — ABNORMAL LOW (ref 12.0–15.0)
LYMPHS PCT: 29 % (ref 12–46)
Lymphs Abs: 1.5 10*3/uL (ref 0.7–4.0)
MCH: 24.6 pg — ABNORMAL LOW (ref 26.0–34.0)
MCHC: 32.1 g/dL (ref 30.0–36.0)
MCV: 76.6 fL — ABNORMAL LOW (ref 78.0–100.0)
MONO ABS: 0.6 10*3/uL (ref 0.1–1.0)
MPV: 9.8 fL (ref 8.6–12.4)
Monocytes Relative: 11 % (ref 3–12)
Neutro Abs: 3 10*3/uL (ref 1.7–7.7)
Neutrophils Relative %: 57 % (ref 43–77)
PLATELETS: 254 10*3/uL (ref 150–400)
RBC: 4.19 MIL/uL (ref 3.87–5.11)
RDW: 14.2 % (ref 11.5–15.5)
WBC: 5.2 10*3/uL (ref 4.0–10.5)

## 2015-05-04 LAB — POCT URINALYSIS DIP (DEVICE)
Bilirubin Urine: NEGATIVE
GLUCOSE, UA: NEGATIVE mg/dL
KETONES UR: NEGATIVE mg/dL
LEUKOCYTES UA: NEGATIVE
Nitrite: NEGATIVE
Protein, ur: NEGATIVE mg/dL
Urobilinogen, UA: 0.2 mg/dL (ref 0.0–1.0)
pH: 5.5 (ref 5.0–8.0)

## 2015-05-04 LAB — COMPLETE METABOLIC PANEL WITH GFR
ALK PHOS: 87 U/L (ref 33–115)
ALT: 16 U/L (ref 6–29)
AST: 16 U/L (ref 10–35)
Albumin: 4.2 g/dL (ref 3.6–5.1)
BUN: 9 mg/dL (ref 7–25)
CALCIUM: 9.2 mg/dL (ref 8.6–10.2)
CHLORIDE: 102 mmol/L (ref 98–110)
CO2: 24 mmol/L (ref 20–31)
Creat: 0.57 mg/dL (ref 0.50–1.10)
GFR, Est African American: >89 mL/min (ref >=60)
GFR, Est Non African American: >89 mL/min (ref >=60)
Glucose, Bld: 87 mg/dL (ref 65–99)
Potassium: 3.7 mmol/L (ref 3.5–5.3)
Sodium: 133 mmol/L — ABNORMAL LOW (ref 135–146)
Total Bilirubin: 0.3 mg/dL (ref 0.2–1.2)
Total Protein: 7.1 g/dL (ref 6.1–8.1)

## 2015-05-04 NOTE — Patient Instructions (Signed)

## 2015-05-04 NOTE — Progress Notes (Signed)
Subjective:    Patient ID: Sonya Gutierrez, female    DOB: February 02, 1971, 45 y.o.   MRN: [id number]  Dysuria  This is a recurrent problem. The current episode started yesterday. The problem occurs intermittently. The problem has been gradually improving. The quality of the pain is described as burning. The pain is at a severity of 2/10. The pain is mild. There has been no fever. She is sexually active. There is no history of pyelonephritis. Associated symptoms include flank pain and hematuria. Pertinent negatives include no chills, discharge, frequency, hesitancy, nausea, possible pregnancy, sweats, urgency or vomiting. She has tried nothing for the symptoms. The treatment provided no relief. Her past medical history is significant for recurrent UTIs.   Past Medical History  Diagnosis Date  . Corneal abrasion    Immunization History  Administered Date(s) Administered  . Influenza,inj,Quad PF,36+ Mos 12/04/2013  . Tdap 09/04/2013  No past surgical history on file.  Social History   Social History  . Marital Status: Married    Spouse Name: N/A  . Number of Children: N/A  . Years of Education: N/A   Occupational History  . Not on file.   Social History Main Topics  . Smoking status: Never Smoker   . Smokeless tobacco: Not on file  . Alcohol Use: No  . Drug Use: No  . Sexual Activity: Not on file   Other Topics Concern  . Not on file   Social History Narrative  . No narrative on file   Review of Systems  Constitutional: Negative for chills.  HENT: Negative.   Eyes: Negative.   Cardiovascular: Negative.  Negative for chest pain, palpitations and leg swelling.  Gastrointestinal: Negative.  Negative for nausea and vomiting.  Endocrine: Negative.   Genitourinary: Positive for dysuria, hematuria and flank pain. Negative for hesitancy, urgency and frequency.  Musculoskeletal: Negative.   Skin: Negative.   Allergic/Immunologic: Negative.   Neurological: Negative.    Hematological: Negative.   Psychiatric/Behavioral: Negative.       Objective:   Physical Exam  Constitutional: She is oriented to person, place, and time. She appears well-developed.  HENT:  Head: Normocephalic and atraumatic.  Right Ear: External ear normal.  Left Ear: External ear normal.  Nose: Nose normal.  Mouth/Throat: Oropharynx is clear and moist.  Eyes: Conjunctivae and EOM are normal. Pupils are equal, round, and reactive to light.  Neck: Normal range of motion. Neck supple.  Cardiovascular: Normal rate, regular rhythm, normal heart sounds and intact distal pulses.   Pulmonary/Chest: Effort normal and breath sounds normal.  Abdominal: Soft. Bowel sounds are normal. There is tenderness in the left lower quadrant.  Musculoskeletal: Normal range of motion.  Neurological: She is alert and oriented to person, place, and time. She has normal reflexes.  Skin: Skin is warm and dry.  Psychiatric: She has a normal mood and affect. Her behavior is normal. Judgment and thought content normal.      BP 100/60 mmHg  Pulse 89  Temp(Src) 97.8 F (36.6 C) (Oral)  Resp 14  Ht 4\' 11"  (1.499 m)  Wt 156 lb (70.761 kg)  BMI 31.49 kg/m2  LMP 04/14/2015 Assessment & Plan:  1. History of UTI Patient states that she was evaluated by a physician several days prior and has 1 tablet remaining. I advised patient to complete antibiotic regimen. I will review labs and follow up by phone. Patient states that she had a surgery several years ago concerning bladder. She is a poor historian  and cannot remember details pertaining to the surgery. Patient may require renal ultrasound due to hematuria. Will follow up in office in 2 weeks.  - COMPLETE METABOLIC PANEL WITH GFR  2. Hematuria Patient was advised to refrain from using OTC NSAIDS for pain due to hematuria. Recommend OTC Tylenol 500 mg every 6 hours prn for mild to moderate pain.  - COMPLETE METABOLIC PANEL WITH GFR - CBC with Differential -  Urine culture  3. Dysuria - COMPLETE METABOLIC PANEL WITH GFR - Urine culture   4. Language Barrier to communication Utilized language line to assist with communication   RTC: Will follow up in office in 2 weeks after reviewing laboratory results   Hollis,Lachina M, FNP  The patient was given clear instructions to go to ER or return to medical center if symptoms do not improve, worsen or new problems develop. The patient verbalized understanding. Will notify patient with laboratory results.

## 2015-05-05 ENCOUNTER — Telehealth: Payer: Self-pay

## 2015-05-05 LAB — URINE CULTURE
Colony Count: NO GROWTH
Organism ID, Bacteria: NO GROWTH

## 2015-05-05 NOTE — Telephone Encounter (Signed)
-----   Message from Dorena Dew, [place] sent at 05/05/2015  8:05 AM EST ----- Please inform patient that hemoglobin is mildly decreased. Recommend increasing green leafy veggies, organ meats, protein sources, beans, and legumes. Follow up in office as scheduled.   Thanks  ----- Message -----    From: Lab in [facility] Interface    Sent: 05/04/2015   9:40 PM      To: Dorena Dew, FNP

## 2015-05-05 NOTE — Telephone Encounter (Signed)
Called using Language [employer] Line ID # [id number]. No answer, a message was left to call back regarding labs. Thanks!

## 2015-05-06 NOTE — Telephone Encounter (Signed)
Sent letter, have been unable to reach patient via phone. Thanks!

## 2015-05-18 ENCOUNTER — Ambulatory Visit (INDEPENDENT_AMBULATORY_CARE_PROVIDER_SITE_OTHER): Payer: Self-pay | Admitting: Family Medicine

## 2015-05-18 ENCOUNTER — Encounter: Payer: Self-pay | Admitting: Family Medicine

## 2015-05-18 VITALS — BP 101/59 | HR 80 | Temp 98.0°F | Resp 16 | Ht 59.0 in | Wt 154.0 lb

## 2015-05-18 DIAGNOSIS — Z789 Other specified health status: Secondary | ICD-10-CM

## 2015-05-18 DIAGNOSIS — B3731 Acute candidiasis of vulva and vagina: Secondary | ICD-10-CM

## 2015-05-18 DIAGNOSIS — N898 Other specified noninflammatory disorders of vagina: Secondary | ICD-10-CM

## 2015-05-18 DIAGNOSIS — L298 Other pruritus: Secondary | ICD-10-CM

## 2015-05-18 DIAGNOSIS — B373 Candidiasis of vulva and vagina: Secondary | ICD-10-CM

## 2015-05-18 LAB — POCT URINALYSIS DIP (DEVICE)
BILIRUBIN URINE: NEGATIVE
Glucose, UA: NEGATIVE mg/dL
HGB URINE DIPSTICK: NEGATIVE
Ketones, ur: NEGATIVE mg/dL
Leukocytes, UA: NEGATIVE
Nitrite: NEGATIVE
Protein, ur: NEGATIVE mg/dL
SPECIFIC GRAVITY, URINE: 1.015 (ref 1.005–1.030)
Urobilinogen, UA: 0.2 mg/dL (ref 0.0–1.0)
pH: 6.5 (ref 5.0–8.0)

## 2015-05-18 LAB — POCT WET + KOH PREP: TRICH BY WET PREP: ABSENT

## 2015-05-18 MED ORDER — FLUCONAZOLE 150 MG PO TABS: 150.0000 mg | ORAL_TABLET | Freq: Once | ORAL | Status: DC

## 2015-05-18 NOTE — Progress Notes (Signed)
Subjective:    Patient ID: Sonya Gutierrez, female    DOB: 04-15-1970, 45 y.o.   MRN: [id number]  Vaginal Itching The patient's primary symptoms include genital itching. This is a new problem. The current episode started in the past 7 days (Ms. Kio was treated for a UTI 2 weeks ago). The problem occurs constantly. The problem has been unchanged. The patient is experiencing no pain. The problem affects both sides. Pertinent negatives include no chills, constipation, diarrhea, discolored urine, dysuria, flank pain, frequency, hematuria, nausea, urgency or vomiting. Nothing aggravates the symptoms. She has tried nothing for the symptoms. She is sexually active. No, her partner does not have an STD. She uses nothing for contraception. Her menstrual history has been regular. There is no history of herpes simplex, menorrhagia or vaginosis.   Past Medical History  Diagnosis Date  . Corneal abrasion    Immunization History  Administered Date(s) Administered  . Influenza,inj,Quad PF,36+ Mos 12/04/2013  . Tdap 09/04/2013  No past surgical history on file.  Social History   Social History  . Marital Status: Married    Spouse Name: N/A  . Number of Children: N/A  . Years of Education: N/A   Occupational History  . Not on file.   Social History Main Topics  . Smoking status: Never Smoker   . Smokeless tobacco: Not on file  . Alcohol Use: No  . Drug Use: No  . Sexual Activity: Not on file   Other Topics Concern  . Not on file   Social History Narrative   Review of Systems  Constitutional: Negative for chills.  HENT: Negative.   Eyes: Negative.   Cardiovascular: Negative.  Negative for chest pain, palpitations and leg swelling.  Gastrointestinal: Negative.  Negative for nausea, vomiting, diarrhea and constipation.  Endocrine: Negative.   Genitourinary: Negative for dysuria, hesitancy, urgency, frequency, hematuria, flank pain and menorrhagia.  Musculoskeletal: Negative.   Skin:  Negative.   Allergic/Immunologic: Negative.   Neurological: Negative.   Hematological: Negative.   Psychiatric/Behavioral: Negative.       Objective:   Physical Exam  Constitutional: She is oriented to person, place, and time. She appears well-developed.  HENT:  Head: Normocephalic and atraumatic.  Right Ear: External ear normal.  Left Ear: External ear normal.  Nose: Nose normal.  Mouth/Throat: Oropharynx is clear and moist.  Eyes: Conjunctivae and EOM are normal. Pupils are equal, round, and reactive to light.  Neck: Normal range of motion. Neck supple.  Cardiovascular: Normal rate, regular rhythm, normal heart sounds and intact distal pulses.   Pulmonary/Chest: Effort normal and breath sounds normal.  Abdominal: Soft. Bowel sounds are normal.  Genitourinary: There is no tenderness or lesion on the right labia. There is no tenderness or lesion on the left labia. Vaginal discharge (thick, white discharge) found.  Musculoskeletal: Normal range of motion.  Neurological: She is alert and oriented to person, place, and time. She has normal reflexes.  Skin: Skin is warm and dry.  Psychiatric: She has a normal mood and affect. Her behavior is normal. Judgment and thought content normal.      LMP 04/14/2015 Assessment & Plan:  1. Vaginal discharge 2-5 yeast per high power field visualized on wetprep.  -POCT urinalysis - POCT wet + KOH prep  2. Itching in the vaginal area - POCT wet + KOH prep  3. Vaginal yeast infection - fluconazole (DIFLUCAN) 150 MG tablet; Take 1 tablet (150 mg total) by mouth once.  Dispense: 2 tablet; Refill:  0 - POCT wet + KOH prep  4. Language Barrier to communication Utilized interpreter to assist with communication  Townsend Cudworth M, FNP  The patient was given clear instructions to go to ER or return to medical center if symptoms do not improve, worsen or new problems develop.

## 2015-05-18 NOTE — Patient Instructions (Signed)

## 2015-07-19 ENCOUNTER — Ambulatory Visit: Payer: Medicaid Other | Attending: Internal Medicine

## 2015-07-20 ENCOUNTER — Encounter (HOSPITAL_COMMUNITY): Payer: Self-pay

## 2015-07-20 ENCOUNTER — Observation Stay (HOSPITAL_COMMUNITY)
Admission: AD | Admit: 2015-07-20 | Discharge: 2015-07-21 | Disposition: A | Discharge status: Home / Self Care | Payer: Medicaid Other | Source: Ambulatory Visit | Attending: Obstetrics & Gynecology | Admitting: Obstetrics & Gynecology

## 2015-07-20 DIAGNOSIS — N938 Other specified abnormal uterine and vaginal bleeding: Secondary | ICD-10-CM | POA: Insufficient documentation

## 2015-07-20 DIAGNOSIS — D649 Anemia, unspecified: Secondary | ICD-10-CM

## 2015-07-20 DIAGNOSIS — N939 Abnormal uterine and vaginal bleeding, unspecified: Secondary | ICD-10-CM | POA: Diagnosis present

## 2015-07-20 DIAGNOSIS — D62 Acute posthemorrhagic anemia: Principal | ICD-10-CM | POA: Insufficient documentation

## 2015-07-20 LAB — COMPREHENSIVE METABOLIC PANEL
ALK PHOS: 67 U/L (ref 38–126)
ALT: 14 U/L (ref 14–54)
AST: 19 U/L (ref 15–41)
Albumin: 3.4 g/dL — ABNORMAL LOW (ref 3.5–5.0)
Anion gap: 8 (ref 5–15)
BUN: 7 mg/dL (ref 6–20)
CALCIUM: 9 mg/dL (ref 8.9–10.3)
CHLORIDE: 108 mmol/L (ref 101–111)
CO2: 22 mmol/L (ref 22–32)
CREATININE: 0.62 mg/dL (ref 0.44–1.00)
Glucose, Bld: 117 mg/dL — ABNORMAL HIGH (ref 65–99)
Potassium: 3.7 mmol/L (ref 3.5–5.1)
SODIUM: 138 mmol/L (ref 135–145)
Total Bilirubin: 0.2 mg/dL — ABNORMAL LOW (ref 0.3–1.2)
Total Protein: 6.3 g/dL — ABNORMAL LOW (ref 6.5–8.1)

## 2015-07-20 LAB — CBC WITH DIFFERENTIAL/PLATELET
BASOS ABS: 0 10*3/uL (ref 0.0–0.1)
Basophils Relative: 0 %
EOS ABS: 0.2 10*3/uL (ref 0.0–0.7)
Eosinophils Relative: 4 %
HCT: 20.7 % — ABNORMAL LOW (ref 36.0–46.0)
HEMOGLOBIN: 5.9 g/dL — AB (ref 12.0–15.0)
LYMPHS PCT: 27 %
Lymphs Abs: 1.4 10*3/uL (ref 0.7–4.0)
MCH: 20.4 pg — ABNORMAL LOW (ref 26.0–34.0)
MCHC: 28.5 g/dL — AB (ref 30.0–36.0)
MCV: 71.6 fL — ABNORMAL LOW (ref 78.0–100.0)
Monocytes Absolute: 0.4 10*3/uL (ref 0.1–1.0)
Monocytes Relative: 8 %
NEUTROS PCT: 61 %
Neutro Abs: 3.3 10*3/uL (ref 1.7–7.7)
PLATELETS: 288 10*3/uL (ref 150–400)
RBC: 2.89 MIL/uL — ABNORMAL LOW (ref 3.87–5.11)
RDW: 15.9 % — ABNORMAL HIGH (ref 11.5–15.5)
WBC: 5.3 10*3/uL (ref 4.0–10.5)

## 2015-07-20 LAB — PREPARE RBC (CROSSMATCH)

## 2015-07-20 LAB — I-STAT BETA HCG BLOOD, ED (MC, WL, AP ONLY)

## 2015-07-20 LAB — ABO/RH: ABO/RH(D): A POS

## 2015-07-20 MED ORDER — MEGESTROL ACETATE 40 MG PO TABS
80.0000 mg | ORAL_TABLET | Freq: Once | ORAL | Status: DC
  Filled 2015-07-20: qty 2

## 2015-07-20 MED ORDER — SODIUM CHLORIDE 0.9 % IV SOLN: 10.0000 mL/h | Freq: Once | INTRAVENOUS | Status: DC

## 2015-07-20 MED ORDER — ACETAMINOPHEN 500 MG PO TABS
1000.0000 mg | ORAL_TABLET | Freq: Once | ORAL | Status: AC
  Administered 2015-07-20: 1000 mg via ORAL
  Filled 2015-07-20: qty 2

## 2015-07-20 NOTE — ED Notes (Signed)
Resident informed RN that pt will be transferred to [facility] after 2nd unit of RBC are started

## 2015-07-20 NOTE — ED Notes (Signed)
Pt here with report of constant vaginal bleeding since Saturday and states it is not her menstrual cycle. She went to PCP today and hr hgb was 7.2. She reports feeling more weak and tired with lower abdominal pain.

## 2015-07-20 NOTE — H&P (Signed)
Gynecology History and Physical Attending Note  Sonya Gutierrez is a 45 y.o. female who was seen in the [facility] ER yesterday night after she reported having heavy bleeding x 4 days. Patient is Spanish-speaking only, Spanish interpreter present for this encounter.  Bleeding was associated with fatigue, lightheadedness, and abdominal pain.  Has known history of fibroids; patient brought copy of ultrasound done at outside office that showed an enlarged fibroid uterus.  In the ED, her Hgb was noted to be 5.9 and she was ordered for a blood transfusion and the plan was to observe her at [facility]. She arrived at [facility] earlier this morning, with her first unit of pRBC infusing.    Pertinent Gynecological History: Menses: flow is excessive with use of 14 pads or tampons on heaviest days  Patient's last menstrual period was 07/07/2015. Bleeding: dysfunctional uterine bleeding Contraception: none DES exposure: denies Blood transfusions: none Sexually transmitted diseases: no past history Previous GYN Procedures: none  Last mammogram: normal Date: 10/01/2013 Last pap: normal Date: 12/04/2013   Past Medical History  Diagnosis Date  . Corneal abrasion     History reviewed. No pertinent past surgical history.  No family history on file.  Social History:  reports that she has never smoked. She does not have any smokeless tobacco history on file. She reports that she does not drink alcohol or use illicit drugs.  Allergies: No Known Allergies  Prescriptions prior to admission  Medication Sig Dispense Refill Last Dose  . butalbital-acetaminophen-caffeine (FIORICET WITH CODEINE) 50-325-40-30 MG per capsule Take 1 capsule by mouth 2 (two) times daily as needed for headache. Reported on 05/18/2015   PRN  . ibuprofen (ADVIL,MOTRIN) 200 MG tablet Take 400 mg by mouth every 6 (six) hours as needed for mild pain.   07/20/2015 at Unknown time  . medroxyPROGESTERone (PROVERA) 10 MG tablet Take 20 mg by mouth daily.   07/20/2015 at  500 pm    Review of Systems  Constitutional: Positive for malaise/fatigue.       Lightheadedness  Gastrointestinal: Positive for abdominal pain.    Blood pressure 101/50, pulse 79, temperature 98.8 F (37.1 C), temperature source Oral, resp. rate 18, height 5\' 3"  (1.6 m), weight 154 lb (69.854 kg), last menstrual period 07/07/2015, SpO2 100 %. Physical Exam  Constitutional: She appears well-developed and well-nourished.  HENT:  Head: Normocephalic and atraumatic.  Neck: Normal range of motion. Neck supple.  Cardiovascular: Normal rate and normal heart sounds.   Respiratory: Effort normal and breath sounds normal.  GI: Soft. Bowel sounds are normal. There is tenderness.  Genitourinary:  +Small amount of blood noted on pad Enlarged uterus  Musculoskeletal: Normal range of motion.  Neurological: She is alert.  Skin: Skin is warm and dry.  Psychiatric: She has a normal mood and affect. Her behavior is normal.    Results for orders placed or performed during the hospital encounter of 07/20/15 (from the past 24 hour(s))  Type and screen [place]     Status: None (Preliminary result)   Collection Time: 07/20/15  7:19 PM  Result Value Ref Range   ABO/RH(D) A POS    Antibody Screen NEG    Sample Expiration 07/23/2015    Unit Number [id number]    Blood Component Type RED CELLS,LR    Unit division 00    Status of Unit ISSUED    Transfusion Status OK TO TRANSFUSE    Crossmatch Result Compatible   ABO/Rh     Status: None  Collection Time: 07/20/15  7:19 PM  Result Value Ref Range   ABO/RH(D) A POS   CBC with Differential     Status: Abnormal   Collection Time: 07/20/15  7:34 PM  Result Value Ref Range   WBC 5.3 4.0 - 10.5 K/uL   RBC 2.89 (L) 3.87 - 5.11 MIL/uL   Hemoglobin 5.9 (LL) 12.0 - 15.0 g/dL   HCT 20.7 (L) 36.0 - 46.0 %   MCV 71.6 (L) 78.0 - 100.0 fL   MCH 20.4 (L) 26.0 - 34.0 pg   MCHC 28.5 (L) 30.0 - 36.0 g/dL   RDW 15.9 (H) 11.5 - 15.5 %    Platelets 288 150 - 400 K/uL   Neutrophils Relative % 61 %   Lymphocytes Relative 27 %   Monocytes Relative 8 %   Eosinophils Relative 4 %   Basophils Relative 0 %   Neutro Abs 3.3 1.7 - 7.7 K/uL   Lymphs Abs 1.4 0.7 - 4.0 K/uL   Monocytes Absolute 0.4 0.1 - 1.0 K/uL   Eosinophils Absolute 0.2 0.0 - 0.7 K/uL   Basophils Absolute 0.0 0.0 - 0.1 K/uL   RBC Morphology POLYCHROMASIA PRESENT   Comprehensive metabolic panel     Status: Abnormal   Collection Time: 07/20/15  7:34 PM  Result Value Ref Range   Sodium 138 135 - 145 mmol/L   Potassium 3.7 3.5 - 5.1 mmol/L   Chloride 108 101 - 111 mmol/L   CO2 22 22 - 32 mmol/L   Glucose, Bld 117 (H) 65 - 99 mg/dL   BUN 7 6 - 20 mg/dL   Creatinine, Ser 0.62 0.44 - 1.00 mg/dL   Calcium 9.0 8.9 - 10.3 mg/dL   Total Protein 6.3 (L) 6.5 - 8.1 g/dL   Albumin 3.4 (L) 3.5 - 5.0 g/dL   AST 19 15 - 41 U/L   ALT 14 14 - 54 U/L   Alkaline Phosphatase 67 38 - 126 U/L   Total Bilirubin 0.2 (L) 0.3 - 1.2 mg/dL   GFR calc non Af Amer >60 >60 mL/min   GFR calc Af Amer >60 >60 mL/min   Anion gap 8 5 - 15  I-Stat beta hCG blood, ED     Status: None   Collection Time: 07/20/15  7:40 PM  Result Value Ref Range   I-stat hCG, quantitative <5.0 <5 mIU/mL   Comment 3          Prepare RBC     Status: None   Collection Time: 07/20/15  8:28 PM  Result Value Ref Range   Order Confirmation ORDER PROCESSED BY BLOOD BANK   Prepare RBC     Status: None   Collection Time: 07/21/15  1:53 AM  Result Value Ref Range   Order Confirmation ORDER PROCESSED BY BLOOD BANK   Type and screen [place]     Status: None (Preliminary result)   Collection Time: 07/21/15  2:25 AM  Result Value Ref Range   ABO/RH(D) A POS    Antibody Screen NEG    Sample Expiration 07/24/2015    Unit Number [id number]    Blood Component Type RED CELLS,LR    Unit division 00    Status of Unit ISSUED    Transfusion Status OK TO TRANSFUSE    Crossmatch Result Compatible     Unit Number [id number]    Blood Component Type RBC LR PHER1    Unit division 00    Status of Unit ALLOCATED  Transfusion Status OK TO TRANSFUSE    Crossmatch Result Compatible   ABO/Rh     Status: None   Collection Time: 07/21/15  2:25 AM  Result Value Ref Range   ABO/RH(D) A POS     No results found.  Assessment/Plan: Symptomatic anemia due to AUB - Will transfuse 3 more units of pRBCs, hoping for a hemoglobin around 10 after transfusion - Megace 80 mg daily ordered - Analgesia as needed - Will discharge to home tomorrow if remains stable with outpatient follow up.  Antonietta Lansdowne A, MD 07/21/2015, 8:06 AM

## 2015-07-20 NOTE — ED Provider Notes (Signed)
CSN: [id number]     Arrival date & time 07/20/15  1855 History   First MD Initiated Contact with Patient 07/20/15 2001     Chief Complaint  Patient presents with  . Vaginal Bleeding    Patient is a 45 y.o. female presenting with vaginal bleeding. The history is provided by the patient and medical records. A language interpreter was used.  Vaginal Bleeding Quality:  Clots and heavier than menses (no tissue) Severity:  Severe Onset quality:  Gradual Duration:  4 days Chronicity:  New Menstrual history:  Regular (LMP April 23) Number of pads used:  ~14 per day Possible pregnancy: no   Context: spontaneously   Context: not foreign body and not genital trauma   Associated symptoms: abdominal pain (lower abd) and fatigue   Associated symptoms: no back pain, no fever, no nausea and no vaginal discharge   Associated symptoms comment:  Lightheaded, worse with standing Risk factors: no bleeding disorder and no gynecological surgery    TVUS on 4/28 with fibroid, enlarged uterus.  Hgb trended down to 7.2 today.  Not on anticoagulatns   Past Medical History  Diagnosis Date  . Corneal abrasion    History reviewed. No pertinent past surgical history. No family history on file. Social History  Substance Use Topics  . Smoking status: Never Smoker   . Smokeless tobacco: None  . Alcohol Use: No   OB History    No data available     Review of Systems  Constitutional: Positive for activity change and fatigue. Negative for fever and chills.  Eyes: Negative for visual disturbance.  Respiratory: Negative for cough, shortness of breath and wheezing.   Cardiovascular: Negative for chest pain.  Gastrointestinal: Positive for abdominal pain (lower abd). Negative for nausea and vomiting.  Genitourinary: Positive for vaginal bleeding. Negative for vaginal discharge and vaginal pain.  Musculoskeletal: Negative for back pain.  Skin: Positive for pallor. Negative for rash.  Neurological: Positive  for light-headedness. Negative for syncope.  All other systems reviewed and are negative.   Allergies  Review of patient's allergies indicates no known allergies.  Home Medications   Prior to Admission medications   Medication Sig Start Date End Date Taking? Authorizing Provider  butalbital-acetaminophen-caffeine (FIORICET WITH CODEINE) 50-325-40-30 MG per capsule Take 1 capsule by mouth 2 (two) times daily as needed for headache. Reported on 05/18/2015   Yes Historical Provider, MD  ibuprofen (ADVIL,MOTRIN) 200 MG tablet Take 400 mg by mouth every 6 (six) hours as needed for mild pain.   Yes Historical Provider, MD  medroxyPROGESTERone (PROVERA) 10 MG tablet Take 20 mg by mouth daily.   Yes Historical Provider, MD   BP 90/50 mmHg  Pulse 90  Temp(Src) 98 F (36.7 C) (Oral)  Resp 16  Ht 5\' 3"  (1.6 m)  Wt 69.854 kg  BMI 27.29 kg/m2  SpO2 99%  LMP 07/07/2015 Physical Exam  Constitutional: She is oriented to person, place, and time. She appears well-developed and well-nourished. No distress.  Fatigued, speaking in full sentences  HENT:  Head: Normocephalic and atraumatic.  Nose: Nose normal.  Eyes:  Pale conj  Neck: Normal range of motion. Neck supple. No tracheal deviation present.  Cardiovascular: Normal rate, regular rhythm and normal heart sounds.   No murmur heard. Pulmonary/Chest: Effort normal and breath sounds normal. No respiratory distress. She has no rales.  Abdominal: Soft. Bowel sounds are normal. She exhibits no distension and no mass. There is tenderness (lower abd).  Genitourinary: Uterus is enlarged.  Uterus is not tender. Cervix exhibits no motion tenderness, no discharge and no friability. Right adnexum displays tenderness. Right adnexum displays no mass. Left adnexum displays no mass and no tenderness.  Slow ooze of dark blood at cervical os (reappears in about 10 seconds after clearing).  No mass. Uterus enlarged.  No friability to cervix.  No lacerations.     Musculoskeletal: Normal range of motion. She exhibits no edema.  Neurological: She is alert and oriented to person, place, and time.  nml gait wo ataxia  Skin: Skin is warm and dry. No rash noted. There is pallor.  Psychiatric: She has a normal mood and affect.  Nursing note and vitals reviewed.   ED Course  Procedures (including critical care time) Labs Review Labs Reviewed  CBC WITH DIFFERENTIAL/PLATELET - Abnormal; Notable for the following:    RBC 2.89 (*)    Hemoglobin 5.9 (*)    HCT 20.7 (*)    MCV 71.6 (*)    MCH 20.4 (*)    MCHC 28.5 (*)    RDW 15.9 (*)    All other components within normal limits  COMPREHENSIVE METABOLIC PANEL - Abnormal; Notable for the following:    Glucose, Bld 117 (*)    Total Protein 6.3 (*)    Albumin 3.4 (*)    Total Bilirubin 0.2 (*)    All other components within normal limits  I-STAT BETA HCG BLOOD, ED ([facility], [facility], [facility] ONLY)  TYPE AND SCREEN  PREPARE RBC (CROSSMATCH)  ABO/RH    Imaging Review No results found. I have personally reviewed and evaluated these images and lab results as part of my medical decision-making.   EKG Interpretation None       MDM   Final diagnoses:  Symptomatic anemia  DUB (dysfunctional uterine bleeding)    VB from os. Reviewed recent TVUS.  Not severely hemorrhaging. Mild tachy. Consented for blood. Hgb 5.8, symptomatic.   Consider estrogen/TXA if she decompensated. Rate of bleeding slow per exam. No mass or laceration. No source to control by my internal exam.  [facility]  Estrogen written for by GYN consultant.  Plan to transfer.  Accepting physician Dr. Harolyn Rutherford, whom I have directly spoken to regarding transfer.    Pt was re-evaluated immediatly prior to tranfer and deemed stable. Vitals without decompensation.  S/p 1u pRBC, 2nd hanging  1:04 AM BP stable.  HR 90.  Feeling OK on interview.    Tammy Sours, MD 07/21/15 [id number]  Varney Biles, MD 07/21/15 [id number]

## 2015-07-20 NOTE — ED Notes (Signed)
Critical hgb reported to dr Kathrynn Humble

## 2015-07-20 NOTE — ED Notes (Signed)
Resident advised RN that Dr. Harolyn Rutherford at [facility] has accepted Pt, he requested that pt be transported after the 1st unit has been administered.  Informed unit Sec.

## 2015-07-20 NOTE — ED Notes (Signed)
[facility] Cancel until further notice@ 2155/will recall.

## 2015-07-21 DIAGNOSIS — D649 Anemia, unspecified: Secondary | ICD-10-CM

## 2015-07-21 DIAGNOSIS — N939 Abnormal uterine and vaginal bleeding, unspecified: Secondary | ICD-10-CM

## 2015-07-21 LAB — TYPE AND SCREEN
ABO/RH(D): A POS
ANTIBODY SCREEN: NEGATIVE
Unit division: 0

## 2015-07-21 LAB — ABO/RH: ABO/RH(D): A POS

## 2015-07-21 LAB — PREPARE RBC (CROSSMATCH)

## 2015-07-21 LAB — CBC
HEMATOCRIT: 33.2 % — AB (ref 36.0–46.0)
Hemoglobin: 10.8 g/dL — ABNORMAL LOW (ref 12.0–15.0)
MCH: 24.3 pg — ABNORMAL LOW (ref 26.0–34.0)
MCHC: 32.5 g/dL (ref 30.0–36.0)
MCV: 74.6 fL — AB (ref 78.0–100.0)
PLATELETS: 234 10*3/uL (ref 150–400)
RBC: 4.45 MIL/uL (ref 3.87–5.11)
RDW: 16.1 % — AB (ref 11.5–15.5)
WBC: 6.3 10*3/uL (ref 4.0–10.5)

## 2015-07-21 MED ORDER — FUROSEMIDE 10 MG/ML IJ SOLN
20.0000 mg | Freq: Once | INTRAMUSCULAR | Status: DC
  Filled 2015-07-21: qty 2

## 2015-07-21 MED ORDER — BUTALBITAL-APAP-CAFFEINE 50-325-40 MG PO TABS
1.0000 | ORAL_TABLET | Freq: Two times a day (BID) | ORAL | Status: DC | PRN
  Administered 2015-07-21: 1 via ORAL
  Filled 2015-07-21 (×2): qty 1

## 2015-07-21 MED ORDER — HYDROMORPHONE HCL 1 MG/ML IJ SOLN: 0.2000 mg | INTRAMUSCULAR | Status: DC | PRN

## 2015-07-21 MED ORDER — BISACODYL 5 MG PO TBEC
5.0000 mg | DELAYED_RELEASE_TABLET | Freq: Every day | ORAL | Status: DC | PRN
  Filled 2015-07-21: qty 1

## 2015-07-21 MED ORDER — MAGNESIUM CITRATE PO SOLN
1.0000 | Freq: Once | ORAL | Status: DC | PRN
  Filled 2015-07-21: qty 296

## 2015-07-21 MED ORDER — SODIUM CHLORIDE 0.9 % IV SOLN
Freq: Once | INTRAVENOUS | Status: AC
  Administered 2015-07-21: 06:00:00 via INTRAVENOUS

## 2015-07-21 MED ORDER — MEGESTROL ACETATE 40 MG PO TABS: 40.0000 mg | ORAL_TABLET | Freq: Three times a day (TID) | ORAL | Status: DC

## 2015-07-21 MED ORDER — TEMAZEPAM 15 MG PO CAPS: 15.0000 mg | ORAL_CAPSULE | Freq: Every evening | ORAL | Status: DC | PRN

## 2015-07-21 MED ORDER — OXYCODONE-ACETAMINOPHEN 5-325 MG PO TABS: 1.0000 | ORAL_TABLET | ORAL | Status: DC | PRN

## 2015-07-21 MED ORDER — ONDANSETRON HCL 4 MG PO TABS: 4.0000 mg | ORAL_TABLET | Freq: Four times a day (QID) | ORAL | Status: DC | PRN

## 2015-07-21 MED ORDER — SODIUM CHLORIDE 0.9 % IV SOLN: Freq: Once | INTRAVENOUS | Status: DC

## 2015-07-21 MED ORDER — DIPHENHYDRAMINE HCL 25 MG PO CAPS
25.0000 mg | ORAL_CAPSULE | Freq: Once | ORAL | Status: AC
  Administered 2015-07-21: 25 mg via ORAL
  Filled 2015-07-21: qty 1

## 2015-07-21 MED ORDER — MEGESTROL ACETATE 40 MG PO TABS
40.0000 mg | ORAL_TABLET | Freq: Two times a day (BID) | ORAL | Status: DC
  Administered 2015-07-21 (×2): 40 mg via ORAL
  Filled 2015-07-21 (×3): qty 1

## 2015-07-21 MED ORDER — MAGNESIUM HYDROXIDE 400 MG/5ML PO SUSP: 30.0000 mL | Freq: Every day | ORAL | Status: DC | PRN

## 2015-07-21 MED ORDER — IBUPROFEN 600 MG PO TABS
600.0000 mg | ORAL_TABLET | Freq: Four times a day (QID) | ORAL | Status: DC | PRN
  Administered 2015-07-21: 600 mg via ORAL
  Filled 2015-07-21: qty 1

## 2015-07-21 MED ORDER — ALUM & MAG HYDROXIDE-SIMETH 200-200-20 MG/5ML PO SUSP: 30.0000 mL | ORAL | Status: DC | PRN

## 2015-07-21 MED ORDER — SODIUM CHLORIDE 0.9 % IV SOLN
INTRAVENOUS | Status: DC
  Administered 2015-07-21: 02:00:00 via INTRAVENOUS

## 2015-07-21 MED ORDER — FUROSEMIDE 10 MG/ML IJ SOLN
20.0000 mg | Freq: Once | INTRAMUSCULAR | Status: AC
  Administered 2015-07-21: 20 mg via INTRAVENOUS
  Filled 2015-07-21: qty 2

## 2015-07-21 MED ORDER — DOCUSATE SODIUM 100 MG PO CAPS
100.0000 mg | ORAL_CAPSULE | Freq: Two times a day (BID) | ORAL | Status: DC
  Administered 2015-07-21 (×2): 100 mg via ORAL
  Filled 2015-07-21 (×2): qty 1

## 2015-07-21 MED ORDER — ONDANSETRON HCL 4 MG/2ML IJ SOLN: 4.0000 mg | Freq: Four times a day (QID) | INTRAMUSCULAR | Status: DC | PRN

## 2015-07-21 NOTE — Discharge Instructions (Signed)
Sangrado uterino anormal (Abnormal Uterine Bleeding) Sangrado uterino anormal significa que hay un sangrado por la vagina que no es su perodo menstrual normal. Puede ser:  Prdidas de sangre o hemorragias entre los perodos.  Hemorragias luego de Best boy sexo [employer]).  Sangrado abundante o ms que lo habitual.  Perodos que duran ms que lo normal.  Sangrado luego de la menopausia. Hay muchos problemas que pueden ser la causa. El tratamiento depender de la causa del sangrado. Cualquier tipo de sangrado que no sea normal debe consultarse con el mdico.  CUIDADOS EN EL HOGAR Controle su afeccin para ver si hay cambios. Estas indicaciones podrn disminuir cualquier molestia que tenga:  No use tampones ni duchas vaginales o como le haya indicado el mdico.  Cambie los apsitos con frecuencia. Deber hacerse exmenes plvicos regulares y pruebas de Papanicolaou. Realice los estudios indicados segn le indique su mdico. SOLICITE AYUDA SI:  El sangrado dura ms de 1 semana.  Se siente mareada por momentos. SOLICITE AYUDA DE INMEDIATO SI:   Se desmaya.  Tiene que [employer] apsitos cada 15 a 30 minutos.  Siente dolor en el abdomen.  Tiene fiebre.  Se siente dbil o presenta sudoracin.  Elimina cogulos grandes por la vagina.  Siente Higher education careers adviser (nuseas) y devuelve (vomita). ASEGRESE DE QUE:  Comprende estas instrucciones.  Controlar su afeccin.  Recibir ayuda de inmediato si no mejora o si empeora.   Esta informacin no tiene Marine scientist el consejo del mdico. Asegrese de hacerle al mdico cualquier pregunta que tenga.   Document Released: 04/01/2010 Document Revised: 03/04/2013 Elsevier Interactive Patient Education 2016 [employer]. Anemia por deficiencia de hierro - Adultos (Iron Deficiency Anemia, Adult) La anemia sucede cuando la cantidad de glbulos rojos sanos es baja. Con frecuencia, la causa es una cantidad  insuficiente de hierro. Esto se denomina anemia por deficiencia de hierro y puede provocarle cansancio y dificultad para respirar. [place] vitaminas segn las indicaciones del mdico.  Consuma alimentos que contengan hierro. Estos incluyen hgado, carne magra, pan de grano integral, huevos, frutas deshidratadas y vegetales de hojas color verde oscuro. SOLICITE AYUDA DE INMEDIATO SI:  Pierde el conocimiento (se desmaya).  Siente dolor en el pecho.  Tiene malestar estomacal (nuseas) o vomita.  Tiene mucha dificultad para respirar cuando realiza actividades.  Se siente dbil.  La frecuencia cardaca est acelerada.  Comienza a sudar sin motivo.  Se siente mareado cuando se levanta de una silla o de la cama. ASEGRESE DE QUE:  Comprende estas instrucciones.  Controlar su afeccin.  Recibir ayuda de inmediato si no mejora o si empeora.   Esta informacin no tiene Marine scientist el consejo del mdico. Asegrese de hacerle al mdico cualquier pregunta que tenga.   Document Released: 06/03/2010 Document Revised: 07/14/2014 Elsevier Interactive Patient Education [employer].

## 2015-07-21 NOTE — Progress Notes (Signed)
Pt arrived at unit with [facility] at [place]. Per report pt received 1 out of 2 units of  PRBC  At [facility] [place].

## 2015-07-21 NOTE — ED Notes (Signed)
Reports given to [facility] 

## 2015-07-21 NOTE — Progress Notes (Signed)
Second unit of blood  Started. Pt tolerating well.

## 2015-07-21 NOTE — Progress Notes (Signed)
Pt discharged home. Discharged instruction given with the assistance of Spanish Interpretor. Vitals within normal limits.

## 2015-07-21 NOTE — Discharge Summary (Signed)
Antenatal Physician Discharge Summary  Patient ID: Sonya Gutierrez MRN: [id number] DOB/AGE: [birth year] 47 y.o.  Admit date: 07/20/2015 Discharge date: 07/21/2015  Admission Diagnoses: ANTENATAL DISCHARGE SUMMARY   Discharge Diagnoses: abnormal uterine bleeding, iron deficiency anemia     Significant Diagnostic Studies:  Results for orders placed or performed during the hospital encounter of 07/20/15 (from the past 168 hour(s))  Type and screen [place]   Collection Time: 07/20/15  7:19 PM  Result Value Ref Range   ABO/RH(D) A POS    Antibody Screen NEG    Sample Expiration 07/23/2015    Unit Number [id number]    Blood Component Type RED CELLS,LR    Unit division 00    Status of Unit ISSUED,FINAL    Transfusion Status OK TO TRANSFUSE    Crossmatch Result Compatible   ABO/Rh   Collection Time: 07/20/15  7:19 PM  Result Value Ref Range   ABO/RH(D) A POS   CBC with Differential   Collection Time: 07/20/15  7:34 PM  Result Value Ref Range   WBC 5.3 4.0 - 10.5 K/uL   RBC 2.89 (L) 3.87 - 5.11 MIL/uL   Hemoglobin 5.9 (LL) 12.0 - 15.0 g/dL   HCT 20.7 (L) 36.0 - 46.0 %   MCV 71.6 (L) 78.0 - 100.0 fL   MCH 20.4 (L) 26.0 - 34.0 pg   MCHC 28.5 (L) 30.0 - 36.0 g/dL   RDW 15.9 (H) 11.5 - 15.5 %   Platelets 288 150 - 400 K/uL   Neutrophils Relative % 61 %   Lymphocytes Relative 27 %   Monocytes Relative 8 %   Eosinophils Relative 4 %   Basophils Relative 0 %   Neutro Abs 3.3 1.7 - 7.7 K/uL   Lymphs Abs 1.4 0.7 - 4.0 K/uL   Monocytes Absolute 0.4 0.1 - 1.0 K/uL   Eosinophils Absolute 0.2 0.0 - 0.7 K/uL   Basophils Absolute 0.0 0.0 - 0.1 K/uL   RBC Morphology POLYCHROMASIA PRESENT   Comprehensive metabolic panel   Collection Time: 07/20/15  7:34 PM  Result Value Ref Range   Sodium 138 135 - 145 mmol/L   Potassium 3.7 3.5 - 5.1 mmol/L   Chloride 108 101 - 111 mmol/L   CO2 22 22 - 32 mmol/L   Glucose, Bld 117 (H) 65 - 99 mg/dL   BUN 7 6 - 20 mg/dL   Creatinine, Ser 0.62 0.44 - 1.00 mg/dL   Calcium 9.0 8.9 - 10.3 mg/dL   Total Protein 6.3 (L) 6.5 - 8.1 g/dL   Albumin 3.4 (L) 3.5 - 5.0 g/dL   AST 19 15 - 41 U/L   ALT 14 14 - 54 U/L   Alkaline Phosphatase 67 38 - 126 U/L   Total Bilirubin 0.2 (L) 0.3 - 1.2 mg/dL   GFR calc non Af Amer >60 >60 mL/min   GFR calc Af Amer >60 >60 mL/min   Anion gap 8 5 - 15  I-Stat beta hCG blood, ED   Collection Time: 07/20/15  7:40 PM  Result Value Ref Range   I-stat hCG, quantitative <5.0 <5 mIU/mL   Comment 3          Prepare RBC   Collection Time: 07/20/15  8:28 PM  Result Value Ref Range   Order Confirmation ORDER PROCESSED BY BLOOD BANK   Prepare RBC   Collection Time: 07/21/15  1:53 AM  Result Value Ref Range   Order Confirmation ORDER PROCESSED BY BLOOD BANK  Type and screen [place]   Collection Time: 07/21/15  2:25 AM  Result Value Ref Range   ABO/RH(D) A POS    Antibody Screen NEG    Sample Expiration 07/24/2015    Unit Number [id number]    Blood Component Type RED CELLS,LR    Unit division 00    Status of Unit ISSUED    Transfusion Status OK TO TRANSFUSE    Crossmatch Result Compatible    Unit Number [id number]    Blood Component Type RBC LR PHER1    Unit division 00    Status of Unit ISSUED    Transfusion Status OK TO TRANSFUSE    Crossmatch Result Compatible    Unit Number [id number]    Blood Component Type RED CELLS,LR    Unit division 00    Status of Unit ISSUED    Transfusion Status OK TO TRANSFUSE    Crossmatch Result Compatible   ABO/Rh   Collection Time: 07/21/15  2:25 AM  Result Value Ref Range   ABO/RH(D) A POS   Prepare RBC   Collection Time: 07/21/15 12:30 PM  Result Value Ref Range   Order Confirmation ORDER PROCESSED BY BLOOD BANK   CBC   Collection Time: 07/21/15  6:05 PM  Result Value Ref Range   WBC 6.3 4.0 - 10.5 K/uL   RBC 4.45 3.87 - 5.11 MIL/uL   Hemoglobin 10.8 (L) 12.0 - 15.0 g/dL   HCT 33.2 (L) 36.0 - 46.0  %   MCV 74.6 (L) 78.0 - 100.0 fL   MCH 24.3 (L) 26.0 - 34.0 pg   MCHC 32.5 30.0 - 36.0 g/dL   RDW 16.1 (H) 11.5 - 15.5 %   Platelets 234 150 - 400 K/uL    Treatments: transfusion 4 units PRBCs  Hospital Course:  This is a 45 y.o.  admitted for symptomatic anemia in the setting of AUB. H found to be 5.9; she was transfused 4 units PRBCs, which she tolerated well, with improvement of symptoms and post-txfn hemoglobin 10.8.  She will be discharged on megace 40 mg tid with plan for f/u in our gynecology clinic. I will contact our admin staff to assist in scheduling that appointment.  Discharge Exam: BP 108/50 mmHg  Pulse 77  Temp(Src) 98.8 F (37.1 C) (Oral)  Resp 18  Ht 5\' 3"  (1.6 m)  Wt 154 lb (69.854 kg)  BMI 27.29 kg/m2  SpO2 99%  LMP 07/07/2015 General appearance: alert, cooperative and appears stated age Resp: clear to auscultation bilaterally Chest wall: no tenderness GI: soft, non-tender; bowel sounds normal; no masses,  no organomegaly Pulses: 2+ and symmetric Skin: Skin color, texture, turgor normal. No rashes or lesions  Chest: RRR, no rubs/murmurs  Discharge Condition: stable  Disposition: 01-Home or Self Care  Discharge Instructions    Discharge patient    Complete by:  As directed             Medication List    STOP taking these medications        medroxyPROGESTERone 10 MG tablet  Commonly known as:  PROVERA      TAKE these medications        butalbital-acetaminophen-caffeine 50-325-40-30 MG capsule  Commonly known as:  FIORICET WITH CODEINE  Take 1 capsule by mouth 2 (two) times daily as needed for headache. Reported on 05/18/2015     ibuprofen 200 MG tablet  Commonly known as:  ADVIL,MOTRIN  Take 400 mg by mouth every 6 (six)  hours as needed for mild pain.     megestrol 40 MG tablet  Commonly known as:  MEGACE  Take 1 tablet (40 mg total) by mouth 3 (three) times daily.           Follow-up Information    Schedule an appointment as soon  as possible for a visit with [facility]   Specialty:  Obstetrics and Gynecology   Contact information:   [place] [facility] [phone]      Signed: Desma Maxim M.D. 07/21/2015, 7:37 PM

## 2015-07-21 NOTE — Progress Notes (Signed)
Awaiting lab for blood to be transfused. Blood not ready.

## 2015-07-22 LAB — TYPE AND SCREEN
ABO/RH(D): A POS
ANTIBODY SCREEN: NEGATIVE
UNIT DIVISION: 0
Unit division: 0
Unit division: 0

## 2015-08-12 ENCOUNTER — Ambulatory Visit: Payer: Self-pay | Admitting: Gynecology

## 2015-08-16 ENCOUNTER — Encounter: Payer: Self-pay | Admitting: Gynecology

## 2015-08-16 ENCOUNTER — Ambulatory Visit (INDEPENDENT_AMBULATORY_CARE_PROVIDER_SITE_OTHER): Payer: Self-pay | Admitting: Gynecology

## 2015-08-16 ENCOUNTER — Telehealth: Payer: Self-pay | Admitting: *Deleted

## 2015-08-16 VITALS — BP 126/78 | Ht 59.75 in | Wt 154.0 lb

## 2015-08-16 DIAGNOSIS — N938 Other specified abnormal uterine and vaginal bleeding: Secondary | ICD-10-CM

## 2015-08-16 DIAGNOSIS — D500 Iron deficiency anemia secondary to blood loss (chronic): Secondary | ICD-10-CM

## 2015-08-16 DIAGNOSIS — D259 Leiomyoma of uterus, unspecified: Secondary | ICD-10-CM

## 2015-08-16 NOTE — Telephone Encounter (Signed)
Referral faxed with office notes to [facility] hospital clinic they will contact pt to schedule.

## 2015-08-16 NOTE — Patient Instructions (Signed)
Anemia inespecfica (Anemia, Nonspecific) La anemia es una enfermedad en la que la concentracin de glbulos rojos o el nivel de hemoglobina en la sangre estn por debajo de lo normal. La hemoglobina es la sustancia de los glbulos rojos que lleva el oxgeno a todo el cuerpo. La anemia da como resultado que los tejidos no reciban la cantidad suficiente de oxgeno.  CAUSAS  Las causas ms frecuentes de anemia son:   Elvina Mattes. El sangrado puede ser interno o externo. Incluye sangrado excesivo debido al perodo (en las mujeres) o por los intestinos.   Dficit nutricional.   Enfermedad renal, tiroidea o heptica crnicas.  Enfermedades de la mdula sea que disminuyen la produccin de glbulos rojos.  Cncer y tratamientos para Science writer.  VIH, sida y sus tratamientos.  Trastornos del bazo que aumentan la destruccin de glbulos rojos.  Enfermedades de [employer].  Destruccin excesiva de glbulos rojos debido a una infeccin, a medicamentos y a Nurse, mental health. SIGNOS Y SNTOMAS   Debilidad leve.   Mareos.   Dolor de [place].  Palpitaciones.   Falta de aire, especialmente con el ejercicio.   Palidez.  Sensibilidad al fro.  Indigestin.  Nuseas.  Dificultad para dormir.  Dificultad para concentrarse. Los sntomas pueden ocurrir repentinamente o pueden Psychologist, forensic.  DIAGNSTICO  Con frecuencia es necesario realizar anlisis de [employer]. Estos ayudan al profesional a Adult nurse. Su mdico controlar la materia fecal para Hydrographic surveyor la presencia de [place] y buscar otras causas de prdida de [place].  TRATAMIENTO  El tratamiento vara segn la causa de la anemia. Las opciones de tratamiento son:   Suplementos de hierro, vitamina K93, o cido flico.   Medicamentos con hormonas.   Transfusin de [place]. Ser necesaria en los casos de prdida de Thayne grave.   Hospitalizacin. Ser necesaria si la  prdida de sangre es continua y significativa.   Cambios en la dieta.  Extirpacin del bazo. INSTRUCCIONES PARA EL CUIDADO EN EL HOGAR Cumpla con todas las visitas de control. Generalmente demora varias semanas corregir la anemia, y es muy importante que el mdico controle su enfermedad y su respuesta al [place]. SOLICITE ATENCIN MDICA DE INMEDIATO SI:   Siente debilidad extrema, falta de aire o dolor en el pecho.   Se siente mareado o tiene dificultad para concentrarse.  Tiene una hemorragia vaginal abundante.   Aparece una erupcin cutnea.   La materia fecal es negra, de aspecto alquitranado.   Se desmaya.   Vomita sangre.   Vomita repetidas veces.   Siente dolor abdominal.  Tiene fiebre o sntomas persistentes durante ms de 2 - 3 das.   Tiene fiebre y los sntomas empeoran repentinamente.   Se deshidrata.  ASEGRESE DE QUE:  Comprende estas instrucciones.  Controlar su afeccin.  Recibir ayuda de inmediato si no mejora o si empeora.   Esta informacin no tiene Marine scientist el consejo del mdico. Asegrese de hacerle al mdico cualquier pregunta que tenga.   Document Released: 02/27/2005 Document Revised: 10/30/2012 Elsevier Interactive Patient Education 2016 [place] abdominal (Abdominal Hysterectomy) La histerectoma abdominal es un procedimiento quirrgico en el que se extirpa el tero. El tero es el rgano muscular donde se desarrolla el feto. Esta ciruga puede hacerse por muchos motivos. Puede necesitar una histerectoma abdominal si tiene cncer, tumores, dolor a largo plazo o hemorragia. Tambin pueden hacerle este procedimiento si el tero ha descendido hacia la vagina (prolapso uterino). Segn las causas por las que necesite una histerectoma  abdominal, es posible que tambin le extirpen otros rganos del Production assistant, radio. Estos podran incluir la parte de la vagina que se conecta con el tero (cuello del  tero), los rganos que producen vulos (ovarios) y las trompas que [employer] ovarios con el tero (trompas de [place]op[place]). INFORME AL MDICO:   Cualquier alergia que tenga.  Todos los [employer], incluidos vitaminas, hierbas, gotas oftlmicas, cremas y medicamentos de venta libre.  Problemas previos que usted o los [employer] de su familia hayan tenido con el uso de anestsicos.  Enfermedades de la sangre que tenga.  Cirugas previas.  Enfermedades que tenga. RIESGOS Y COMPLICACIONES En general, se trata de un procedimiento seguro. Sin embargo, Engineer, technical sales, pueden surgir problemas. La infeccin es el problema ms frecuente despus de una histerectoma abdominal. Otros problemas posibles incluyen:  Hemorragias.  Formacin de cogulos sanguneos que pueden desprenderse y Sports administrator a los pulmones.  Lesin en otros rganos cercanos al tero.  Lesin en los nervios que causa neuralgia.  Menor inters sexual o [place]. ANTES DEL [place] abdominal es un procedimiento quirrgico mayor. Puede afectar la percepcin que tiene de usted [place]. Hable con el mdico [employer] fsicos y emocionales que puede causar la histerectoma.  Quiz deban hacerle anlisis de [place] y radiografas antes de la [place].  Si fuma, deje de hacerlo. Pdale ayuda al mdico si est teniendo inconvenientes para dejar de fumar.  Deje de tomar medicamentos anticoagulantes segn las indicaciones del mdico.  Pueden indicarle que tome antibiticos o laxantes antes de la [place].  No coma ni beba nada durante las 6 a 8 horas previas a la [place].  Tome sus medicamentos habituales con un sorbito de [place].  Tome un bao de inmersin o una ducha la noche o la maana anterior al procedimiento. PROCEDIMIENTO  La histerectoma abdominal se hace en el quirfano del hospital.  En la mayora de los [place], se le administrar un  medicamento que la har dormir (anestesia general).  El cirujano har un corte (incisin) a travs de la piel en la parte inferior del abdomen.  La incisin puede tener de 5a 7pulgadas de [place]. Puede ser horizontal o vertical.  El cirujano apartar el tejido que recubre al tero. Luego, extraer con cuidado el tero junto con cualquier otro rgano del aparato reproductor que Media planner.  La hemorragia se controlar con pinzas o suturas.  El cirujano cerrar la incisin con suturas o clips metlicos. DESPUS DEL PROCEDIMIENTO  Sentir algo de dolor inmediatamente despus del procedimiento.  Le administrarn medicamentos para calmar el dolor cuando est en el rea de recuperacin.  La llevarn a la habitacin del hospital cuando se haya recuperado de la anestesia.  Es posible que Arboriculturist hospital durante 2a 5das.  Recibir instrucciones para recuperarse en su casa.   Esta informacin no tiene Marine scientist el consejo del mdico. Asegrese de hacerle al mdico cualquier pregunta que tenga.   Document Released: 03/04/2013 Elsevier Interactive Patient Education 2016 [place] uterinos (Uterine Fibroids) Los fibromas uterinos son masas (tumores) de tejido que pueden desarrollarse en el vientre (tero). Tambin se los [employer]. Este tipo de tumor no es Radio broadcast assistant (benigno) y no se disemina a Airline pilot del cuerpo fuera de la zona plvica, la cual se encuentra entre los huesos de la cadera. En ocasiones, los fibromas pueden crecer en las trompas de Falopio, en el cuello del tero o en las  estructuras de soporte (ligamentos) que rodean el tero. Una mujer puede tener uno o ms fibromas. Los fibromas pueden tener diferente tamao y [place], y crecer en distintas partes del tero. Algunos pueden crecer hasta volverse bastante grandes. La mayora no requiere tratamiento mdico. CAUSAS Un fibroma puede desarrollarse cuando una nica clula  uterina contina creciendo (se multiplica). La mayora de las clulas del cuerpo humano tienen un mecanismo de control que impide que se multipliquen sin control. [place] los sntomas se pueden incluir los siguientes:   Hemorragias intensas durante la menstruacin.  Prdidas de sangre o [employer].  Dolor y opresin en la pelvis.  Problemas de la vejiga, como necesidad de Garment/textile technologist con ms frecuencia (polaquiuria) o necesidad imperiosa de Garment/textile technologist.  Incapacidad para reproducir (infertilidad).  Abortos espontneos. DIAGNSTICO Los fibromas uterinos se diagnostican con un examen fsico. El mdico puede palpar los tumores grumosos durante un examen plvico. Pueden realizarse ecografas y Ardelia Mems resonancia magntica para determinar el tamao y la ubicacin de los fibromas, as como la cantidad. TRATAMIENTO El tratamiento puede incluir lo siguiente:  Observacin cautelosa. Esto requiere que el mdico controle el fibroma para saber si crece o se achica. Siga las recomendaciones del mdico respecto de la frecuencia con la que debe realizarse los controles.  Medicamentos hormonales. Pueden tomarse por va oral o administrarse a travs de un dispositivo intrauterino (DIU).  Ciruga.  Extirpacin de los fibromas (miomectoma) o del tero (histerectoma).  Suprimir la irrigacin sangunea a los fibromas (embolizacin de la arteria uterina). Si los fibromas le traen problemas de fertilidad y tiene deseos de quedar [place], el mdico puede recomendar su extirpacin.  INSTRUCCIONES PARA EL CUIDADO EN EL HOGAR  Concurra a todas las visitas de control como se lo haya indicado el mdico. Esto es importante.  Tome los medicamentos solamente como se lo haya indicado el mdico.  Si le recetaron un tratamiento hormonal, tome los medicamentos hormonales exactamente como se lo indicaron.  No tome aspirina, ya que puede causar hemorragias.  Consulte al [employer] sobre  tomar comprimidos de hierro y Garment/textile technologist la cantidad de verduras de hoja color verde oscuro en la dieta. Estas medidas pueden ayudar a Transport planner de hierro en la [place], que pueden verse afectados por las hemorragias menstruales intensas.  Preste mucha atencin a Hydrographic surveyor e informe al mdico si hay algn cambio, por ejemplo:  Aumento del flujo de sangre que le exige el uso de ms compresas o tampones que los que utiliza normalmente cada mes.  Un cambio en la cantidad de [employer] dura la menstruacin cada mes.  Un cambio en los sntomas asociados con la [place], como clicos abdominales o dolor de espalda. SOLICITE ATENCIN MDICA SI:  Tiene dolor plvico, dolor de espalda o clicos abdominales que los medicamentos no Engineer, petroleum.  Observa un aumento del sangrado entre y [employer].  Empapa los tampones o las compresas en el trmino de media hora o [place].  Se siente mareada, muy cansada o dbil. SOLICITE ATENCIN MDICA DE INMEDIATO SI:  Se desmaya.  El dolor plvico aumenta repentinamente.   Esta informacin no tiene Marine scientist el consejo del mdico. Asegrese de hacerle al mdico cualquier pregunta que tenga.   Document Released: 02/27/2005 Document Revised: 03/20/2014 Elsevier Interactive Patient Education [employer].

## 2015-08-16 NOTE — Telephone Encounter (Signed)
-----   Message from Terrance Mass, MD sent at 08/16/2015  2:43 PM EDT ----- Anderson Malta, please make an appointment for this patient at the [facility] hospital teaching service with Dr.Ugonna Anyanwu. Patient with symptomatic fibroid uterus that she had seen in the emergency room for follow-up.

## 2015-08-16 NOTE — Progress Notes (Signed)
   HPI: Patient is a 45 year old gravida 5 para 4 (normal spontaneous vaginal delivery/BTL) 1 miscarriage resulting D&C several years ago. Patient is here today because of her history of leiomyomatous uteri and anemia. Patient had been originally followed at the [facility] medical clinic on [facility] where they had diagnosed her with severe anemia where her hemoglobin was as low as 7.4 and was started on supplemental iron and they had given her also medroxyprogesterone 5 mg by mouth daily. At that office the also did an endometrial biopsy which I have a copy of the report which stated that there was no hyperplasia or any evidence of malignancy only proliferative endometrium. She then was admitted through the emergency room on May 9 because of her severe anemia whereby her hemoglobin had dropped to 5.3 and while in the hospital she was transfused 4 units of packed red blood cell and was discharged home with a hemoglobin of 10.8. She's currently on Megace 40 mg 3 times a day and iron supplementation 1 twice a day. She is complaining of no bleeding today runs discharge on and off at times but no further heavy bleeding while on this regimen. She does feel tired and weak at times. Patient with no past history of any abnormal Pap smear her last Pap smear was in 2015 which was normal.   ROS: A ROS was performed and pertinent positives and negatives are included in the history.  GENERAL: No fevers or chills. HEENT: No change in vision, no earache, sore throat or sinus congestion. NECK: No pain or stiffness. CARDIOVASCULAR: No chest pain or pressure. No palpitations. PULMONARY: No shortness of breath, cough or wheeze. GASTROINTESTINAL: No abdominal pain, nausea, vomiting or diarrhea, melena or bright red blood per rectum. GENITOURINARY: No urinary frequency, urgency, hesitancy or dysuria. MUSCULOSKELETAL: No joint or muscle pain, no back pain, no recent trauma. DERMATOLOGIC: No rash, no itching, no lesions. ENDOCRINE:  No polyuria, polydipsia, no heat or cold intolerance. No recent change in weight. HEMATOLOGICAL: See above NEUROLOGIC: No headache, seizures, numbness, tingling or weakness. PSYCHIATRIC: No depression, no loss of interest in normal activity or change in sleep pattern.   PE: Blood pressure 126/78 Gen. appearance well-developed well-nourished female with the above-mentioned complaint Abdomen: Soft nontender no rebound or guarding Pelvic: Bartholin urethra Skene glands within normal limits Vagina: No blood noted in the vaginal vault Cervix: No active bleeding no lesions seen. Uterus: Bimanual examination demonstrated 14 week size firm irregular uterus Adnexa difficult to evaluate due to the size of fibroid uterus Rectal exam deferred    Assessment Plan: 45 year old patient with symptomatic leiomyomatous uteri will need a total abdominal hysterectomy once her hemoglobin is return back to a safer range. I'm going to refer her to my GYN colleagues at [facility] hospital teaching service with Miguel Aschoff who had admitted her back on May 9th since patient has limited finances to cover expenses for her surgery. Even despite giving her a discount it would be more than she can afford. Our healthcare system will be able to work with her financially and this is the reason for the referral to the [facility].    Greater than 50% of time was spent in counseling and coordinating care of this patient.   Time of consultation 20 minutes.

## 2015-08-19 NOTE — Telephone Encounter (Signed)
Spoke with patient daughter and told her that women[facility] clinic did received notes and they will be contacting her to schedule, and they are booking out into July now. Pt daughter verbalized she understood

## 2015-09-07 NOTE — Telephone Encounter (Signed)
Appointment 10/13/15 @ 3:00pm with Dr.Myra Hulan Fray.  Rosemarie Ax will inform pt.

## 2015-09-08 NOTE — Telephone Encounter (Signed)
Sonya Gutierrez informed pt today of the below

## 2015-10-13 ENCOUNTER — Ambulatory Visit (INDEPENDENT_AMBULATORY_CARE_PROVIDER_SITE_OTHER): Payer: Medicaid Other | Admitting: Obstetrics & Gynecology

## 2015-10-13 ENCOUNTER — Encounter: Payer: Self-pay | Admitting: Obstetrics & Gynecology

## 2015-10-13 ENCOUNTER — Other Ambulatory Visit (HOSPITAL_COMMUNITY)
Admission: RE | Admit: 2015-10-13 | Discharge: 2015-10-13 | Disposition: A | Discharge status: Home / Self Care | Payer: Medicaid Other | Source: Ambulatory Visit | Attending: Obstetrics & Gynecology | Admitting: Obstetrics & Gynecology

## 2015-10-13 VITALS — BP 117/65 | HR 81 | Ht <= 58 in | Wt 160.0 lb

## 2015-10-13 DIAGNOSIS — Z0000 Encounter for general adult medical examination without abnormal findings: Secondary | ICD-10-CM | POA: Diagnosis not present

## 2015-10-13 DIAGNOSIS — Z1151 Encounter for screening for human papillomavirus (HPV): Secondary | ICD-10-CM | POA: Insufficient documentation

## 2015-10-13 DIAGNOSIS — D259 Leiomyoma of uterus, unspecified: Secondary | ICD-10-CM | POA: Diagnosis not present

## 2015-10-13 DIAGNOSIS — Z01411 Encounter for gynecological examination (general) (routine) with abnormal findings: Secondary | ICD-10-CM | POA: Insufficient documentation

## 2015-10-13 NOTE — Progress Notes (Signed)
Video interpreter [phone]

## 2015-10-13 NOTE — Progress Notes (Signed)
   Subjective:    Patient ID: Sonya Gutierrez, female    DOB: Jul 18, 1970, 45 y.o.   MRN: [id number]  HPI HPI: Patient is a 45 year old gravida 5 para 4 (normal spontaneous vaginal delivery/BTL) 1 miscarriage resulting D&C several years ago. Patient is here today because of her history of leiomyomatous uteri and anemia. Patient had been originally followed at the Gen[facility] medical clinic on [facility] where they had diagnosed her with severe anemia where her hemoglobin was as low as 7.4 and was started on supplemental iron and they had given her also medroxyprogesterone 5 mg by mouth daily. At that office the also did an endometrial biopsy which I have a copy of the report which stated that there was no hyperplasia or any evidence of malignancy only proliferative endometrium. She then was admitted through the emergency room on May 9 because of her severe anemia whereby her hemoglobin had dropped to 5.3 and while in the hospital she was transfused 4 units of packed red blood cell and was discharged home with a hemoglobin of 10.8. She's currently on Megace 40 mg 3 times a day and iron supplementation 1 twice a day. She is complaining of no bleeding today runs discharge on and off at times but no further heavy bleeding while on this regimen. She does feel tired and weak at times. Patient with no past history of any abnormal Pap smear her last Pap smear was in 2015 which was normal.   Review of Systems     Objective:   Physical Exam  [id number] Abd- moderately obese, benign 16 week size uterus      Assessment & Plan:  Fibroids, menorrhagia, anemia- check pap smear and gyn u/s

## 2015-10-14 LAB — CYTOLOGY - PAP

## 2015-10-21 ENCOUNTER — Ambulatory Visit (HOSPITAL_COMMUNITY)
Admission: RE | Admit: 2015-10-21 | Discharge: 2015-10-21 | Disposition: A | Discharge status: Home / Self Care | Payer: Medicaid Other | Source: Ambulatory Visit | Attending: Obstetrics & Gynecology | Admitting: Obstetrics & Gynecology

## 2015-10-21 DIAGNOSIS — D259 Leiomyoma of uterus, unspecified: Secondary | ICD-10-CM | POA: Insufficient documentation

## 2015-10-21 DIAGNOSIS — N800 Endometriosis of uterus: Secondary | ICD-10-CM | POA: Diagnosis not present

## 2015-10-27 ENCOUNTER — Ambulatory Visit (INDEPENDENT_AMBULATORY_CARE_PROVIDER_SITE_OTHER): Payer: Medicaid Other | Admitting: Obstetrics & Gynecology

## 2015-10-27 ENCOUNTER — Encounter: Payer: Self-pay | Admitting: Obstetrics & Gynecology

## 2015-10-27 VITALS — BP 122/85 | HR 90 | Wt 158.4 lb

## 2015-10-27 DIAGNOSIS — D259 Leiomyoma of uterus, unspecified: Secondary | ICD-10-CM

## 2015-10-27 DIAGNOSIS — Z1231 Encounter for screening mammogram for malignant neoplasm of breast: Secondary | ICD-10-CM

## 2015-10-27 NOTE — Progress Notes (Signed)
[employer] Interpreter # [phone]

## 2015-11-03 ENCOUNTER — Encounter (HOSPITAL_COMMUNITY): Payer: Self-pay | Admitting: *Deleted

## 2015-11-18 ENCOUNTER — Encounter: Payer: Self-pay | Admitting: Family Medicine

## 2015-11-18 ENCOUNTER — Ambulatory Visit (INDEPENDENT_AMBULATORY_CARE_PROVIDER_SITE_OTHER): Payer: Medicaid Other | Admitting: Family Medicine

## 2015-11-18 VITALS — BP 101/60 | HR 81 | Temp 98.3°F | Resp 16 | Ht <= 58 in | Wt 160.0 lb

## 2015-11-18 DIAGNOSIS — Z0000 Encounter for general adult medical examination without abnormal findings: Secondary | ICD-10-CM | POA: Diagnosis not present

## 2015-11-18 LAB — LIPID PANEL
CHOL/HDL RATIO: 4 ratio (ref <=5.0)
Cholesterol: 241 mg/dL — ABNORMAL HIGH (ref 125–200)
HDL: 60 mg/dL (ref >=46)
LDL Cholesterol: 155 mg/dL — ABNORMAL HIGH (ref <130)
TRIGLYCERIDES: 130 mg/dL (ref <150)
VLDL: 26 mg/dL (ref <30)

## 2015-11-18 LAB — CBC WITH DIFFERENTIAL/PLATELET
BASOS ABS: 0 {cells}/uL (ref 0–200)
BASOS PCT: 0 %
EOS ABS: 200 {cells}/uL (ref 15–500)
Eosinophils Relative: 4 %
HCT: 41 % (ref 35.0–45.0)
Hemoglobin: 13.6 g/dL (ref 11.7–15.5)
LYMPHS PCT: 25 %
Lymphs Abs: 1250 cells/uL (ref 850–3900)
MCH: 26.3 pg — ABNORMAL LOW (ref 27.0–33.0)
MCHC: 33.2 g/dL (ref 32.0–36.0)
MCV: 79.3 fL — AB (ref 80.0–100.0)
MONOS PCT: 10 %
MPV: 9.8 fL (ref 7.5–12.5)
Monocytes Absolute: 500 cells/uL (ref 200–950)
Neutro Abs: 3050 cells/uL (ref 1500–7800)
Neutrophils Relative %: 61 %
PLATELETS: 224 10*3/uL (ref 140–400)
RBC: 5.17 MIL/uL — AB (ref 3.80–5.10)
RDW: 15.7 % — ABNORMAL HIGH (ref 11.0–15.0)
WBC: 5 10*3/uL (ref 3.8–10.8)

## 2015-11-18 LAB — COMPLETE METABOLIC PANEL WITH GFR
ALBUMIN: 4.3 g/dL (ref 3.6–5.1)
ALT: 27 U/L (ref 6–29)
AST: 19 U/L (ref 10–35)
Alkaline Phosphatase: 101 U/L (ref 33–115)
BILIRUBIN TOTAL: 0.4 mg/dL (ref 0.2–1.2)
BUN: 13 mg/dL (ref 7–25)
CO2: 24 mmol/L (ref 20–31)
CREATININE: 0.61 mg/dL (ref 0.50–1.10)
Calcium: 9.4 mg/dL (ref 8.6–10.2)
Chloride: 101 mmol/L (ref 98–110)
GFR, Est Non African American: >89 mL/min (ref >=60)
GLUCOSE: 92 mg/dL (ref 65–99)
Potassium: 3.9 mmol/L (ref 3.5–5.3)
Sodium: 137 mmol/L (ref 135–146)
TOTAL PROTEIN: 7.2 g/dL (ref 6.1–8.1)

## 2015-11-18 NOTE — Progress Notes (Signed)
Sonya Gutierrez, is a 45 y.o. female  [id number]  [id number]  DOB - 12-25-1970  CC:  Chief Complaint  Patient presents with  . Follow-up       HPI: Sonya Gutierrez is a 45 y.o. female here for follow-up. She denies chronic illness, except for menstrural related problems. She has an upcoming appt with GYN. Her only regular medication is Megace 40 mg. She has a mammogram scheduled. She needs a flu shot when available. She was recently transfused due to symptomatic anemia from excessive menstural bleeding.   No Known Allergies Past Medical History:  Diagnosis Date  . Corneal abrasion   . Fibroid    Current Outpatient Prescriptions on File Prior to Visit  Medication Sig Dispense Refill  . ibuprofen (ADVIL,MOTRIN) 200 MG tablet Take 400 mg by mouth every 6 (six) hours as needed for mild pain. Reported on 08/16/2015    . megestrol (MEGACE) 40 MG tablet Take 1 tablet (40 mg total) by mouth 3 (three) times daily. (Patient not taking: Reported on 11/18/2015) 90 tablet 2   No current facility-administered medications on file prior to visit.    History reviewed. No pertinent family history. Social History   Social History  . Marital status: Married    Spouse name: N/A  . Number of children: N/A  . Years of education: N/A   Occupational History  . Not on file.   Social History Main Topics  . Smoking status: Never Smoker  . Smokeless tobacco: Never Used  . Alcohol use No  . Drug use: No  . Sexual activity: Yes     Comment: tubal ligation   Other Topics Concern  . Not on file   Social History Narrative  . No narrative on file    Review of Systems: Constitutional: Negative Skin: Negative HENT: Negative  Eyes: Negative  Neck: Negative Respiratory: Negative Cardiovascular: Negative Gastrointestinal: Negative Genitourinary: Negative  Musculoskeletal: Negative   Neurological: Negative for Hematological: Negative  Psychiatric/Behavioral: Negative    Objective:    Vitals:   11/18/15 1030  BP: 101/60  Pulse: 81  Resp: 16  Temp: 98.3 F (36.8 C)    Physical Exam: Constitutional: Patient appears well-developed and well-nourished. No distress. HENT: Normocephalic, atraumatic, External right and left ear normal. Oropharynx is clear and moist.  Eyes: Conjunctivae and EOM are normal. PERRLA, no scleral icterus. Neck: Normal ROM. Neck supple. No lymphadenopathy, No thyromegaly. CVS: RRR, S1/S2 +, no murmurs, no gallops, no rubs Pulmonary: Effort and breath sounds normal, no stridor, rhonchi, wheezes, rales.  Abdominal: Soft. Normoactive BS,, no distension, tenderness, rebound or guarding.  Musculoskeletal: Normal range of motion. No edema and no tenderness.  Neuro: Alert.Normal muscle tone coordination. Non-focal Skin: Skin is warm and dry. No rash noted. Not diaphoretic. No erythema. No pallor. Psychiatric: Normal mood and affect. Behavior, judgment, thought content normal.  Lab Results  Component Value Date   WBC 6.3 07/21/2015   HGB 10.8 (L) 07/21/2015   HCT 33.2 (L) 07/21/2015   MCV 74.6 (L) 07/21/2015   PLT 234 07/21/2015   Lab Results  Component Value Date   CREATININE 0.62 07/20/2015   BUN 7 07/20/2015   NA 138 07/20/2015   K 3.7 07/20/2015   CL 108 07/20/2015   CO2 22 07/20/2015    Lab Results  Component Value Date   HGBA1C 5.0 09/04/2013   Lipid Panel     Component Value Date/Time   CHOL 210 (H) 12/04/2013 1112   TRIG 105 12/04/2013 1112  HDL 58 12/04/2013 1112   CHOLHDL 3.6 12/04/2013 1112   VLDL 21 12/04/2013 1112   LDLCALC 131 (H) 12/04/2013 1112       Assessment and plan:   1. Healthcare maintenance  - COMPLETE METABOLIC PANEL WITH GFR - CBC with Differential - Lipid panel - Hemoglobin A1c   No Follow-up on file.  The patient was given clear instructions to go to ER or return to medical center if symptoms don't improve, worsen or new problems develop. The patient verbalized understanding.     Micheline Chapman FNP  11/18/2015, 11:29 AM

## 2015-11-19 LAB — HEMOGLOBIN A1C
HEMOGLOBIN A1C: 5.2 % (ref <5.7)
Mean Plasma Glucose: 103 mg/dL

## 2015-12-28 IMAGING — MG MS DIGITAL SCREENING BILAT
4 series · 4 of 4 positions shown · non-contrast
Comparison: None.

CLINICAL DATA: Screening.

EXAM:
DIGITAL SCREENING BILATERAL MAMMOGRAM WITH CAD

[R CC]
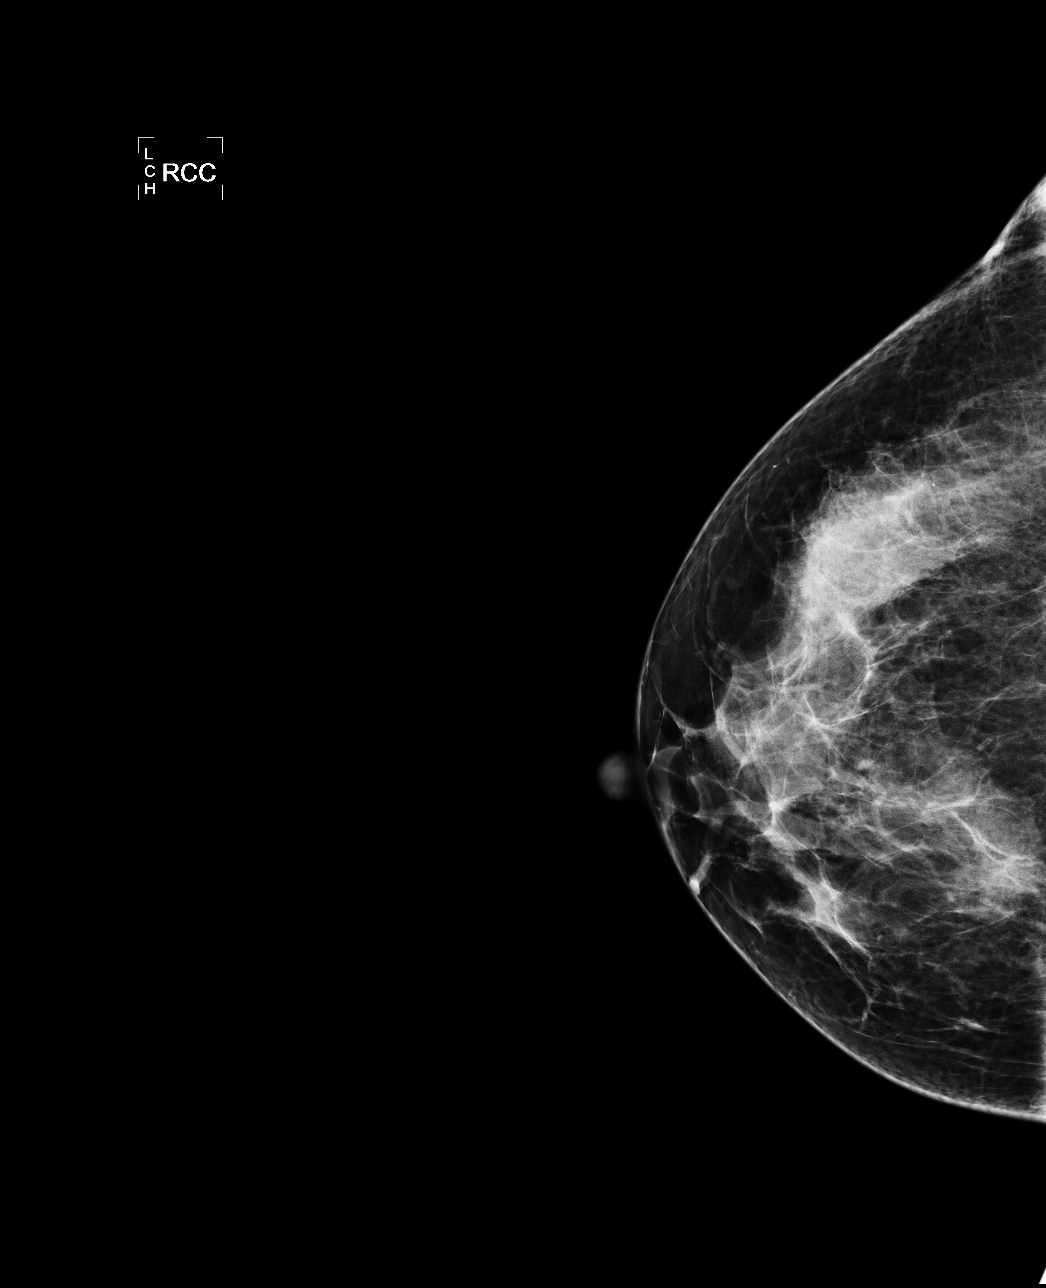

[R MLO]
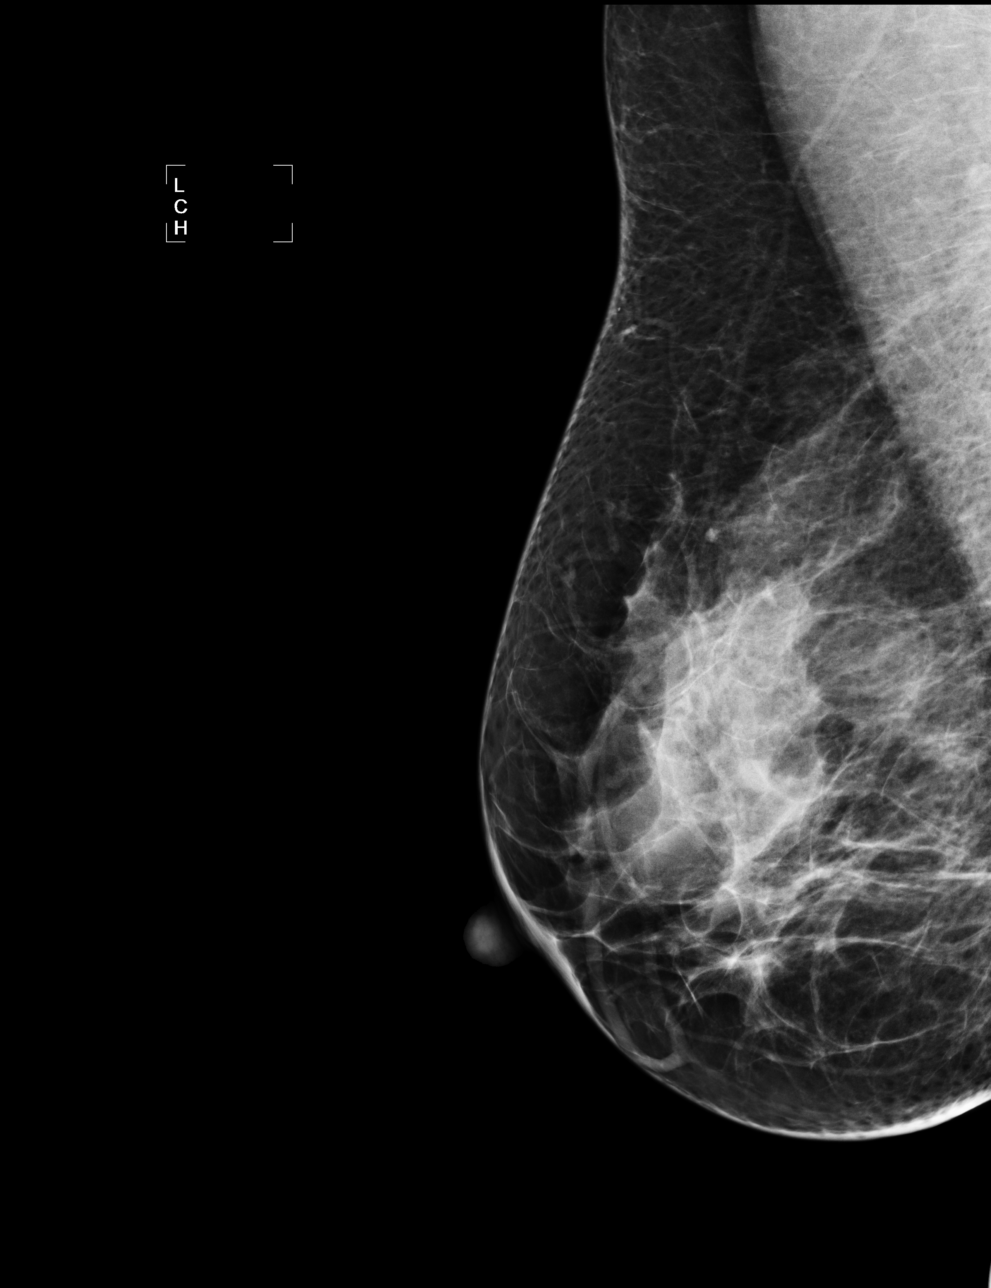

[L CC]
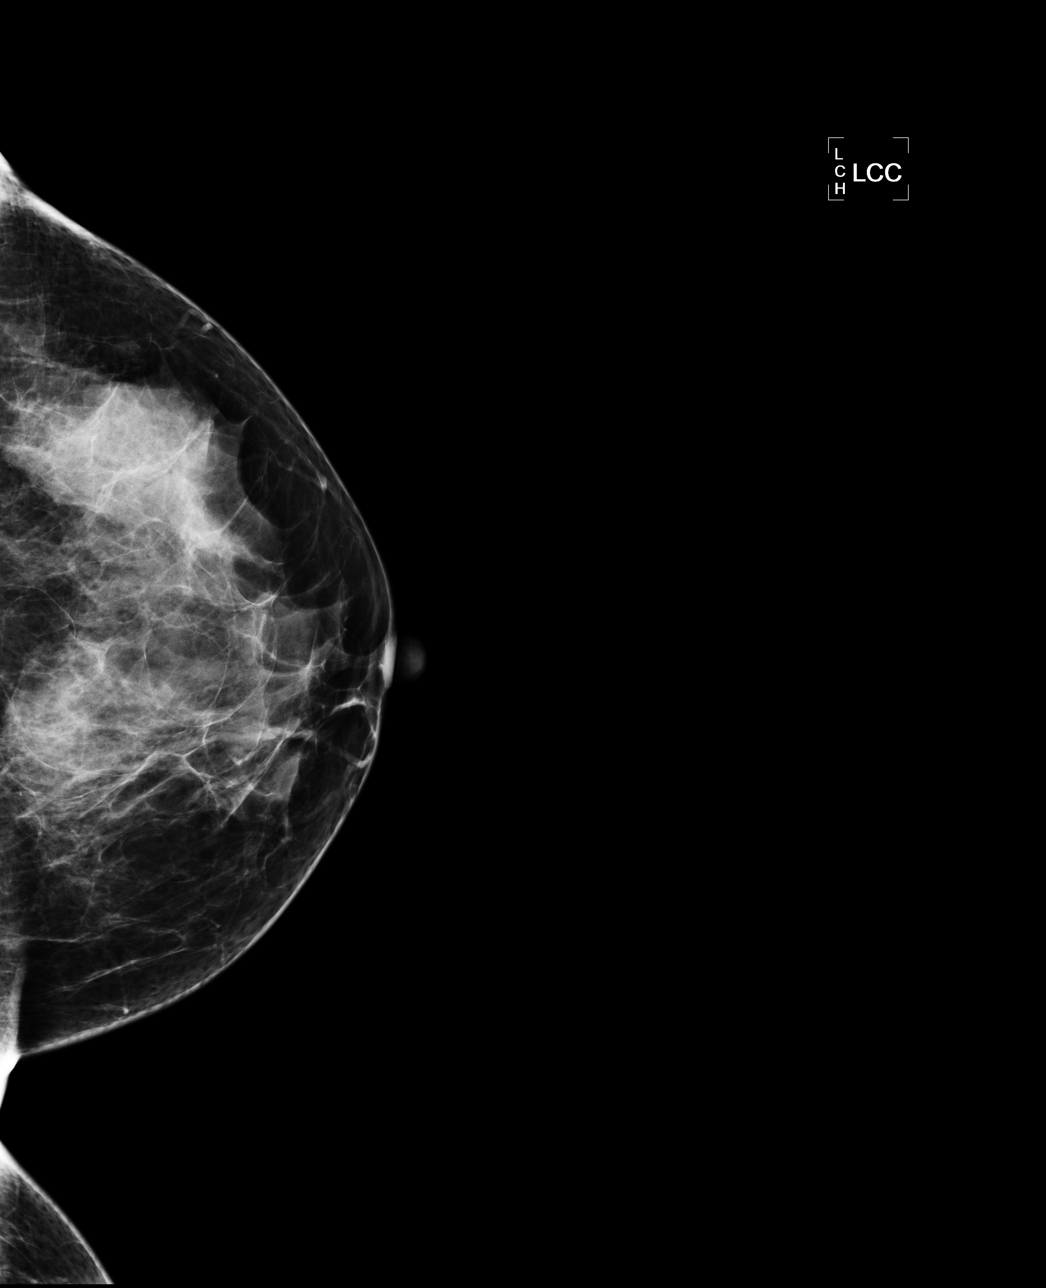

[L MLO]
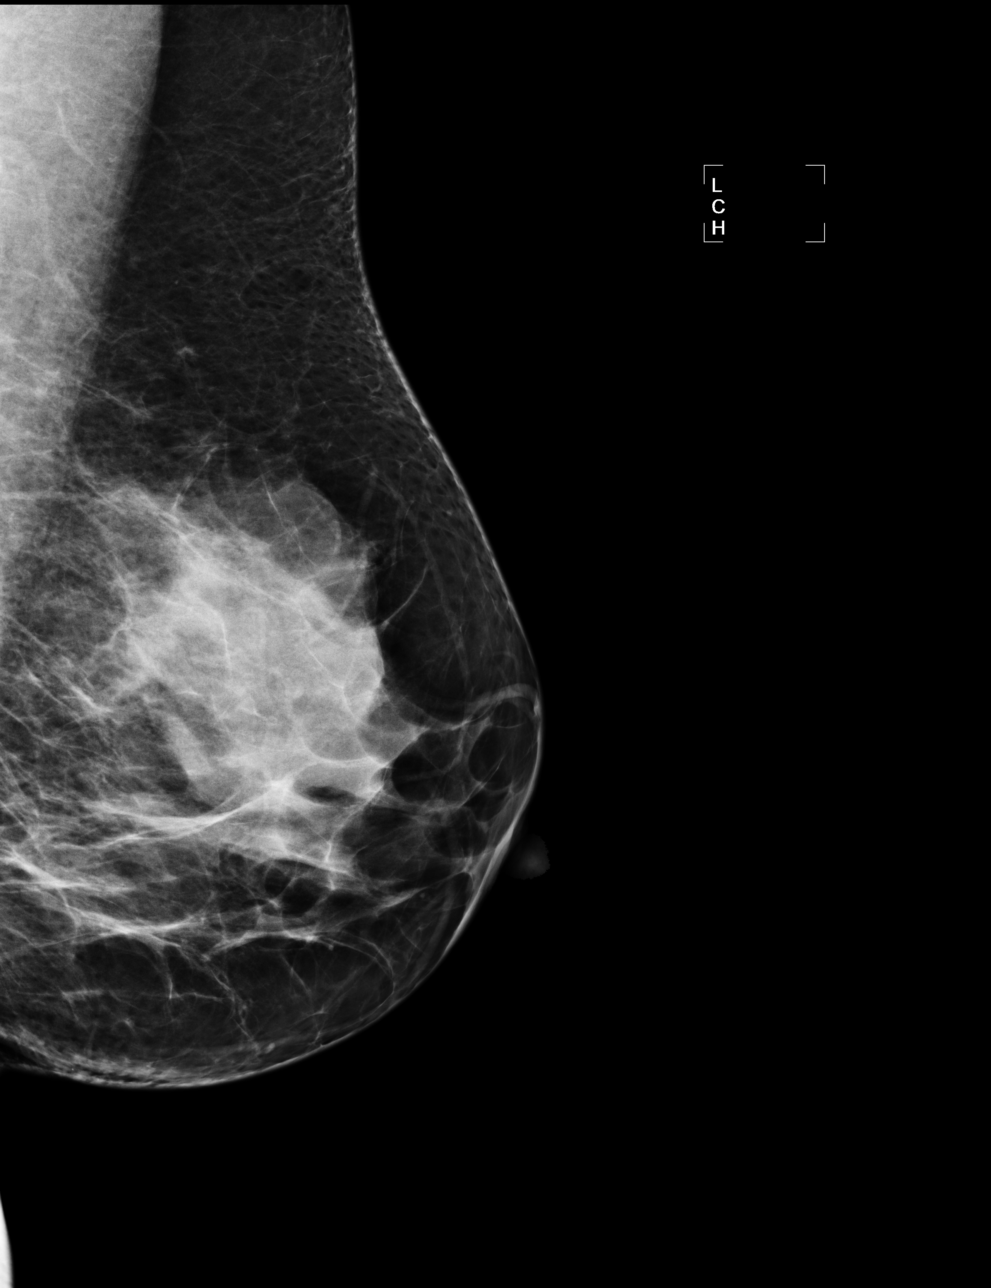

[4 of 4 positions shown; findings below may reference images not displayed]

ACR Breast Density Category c: The breast tissue is heterogeneously
dense, which may obscure small masses
FINDINGS: There are no findings suspicious for malignancy. Images were
processed with CAD.
IMPRESSION: No mammographic evidence of malignancy. A result letter of this
screening mammogram will be mailed directly to the patient.

RECOMMENDATION:
Screening mammogram in one year. (Code:U2-0-761)

BI-RADS CATEGORY  1: Negative.

## 2015-12-31 NOTE — Patient Instructions (Addendum)
Your procedure is scheduled on:  Tuesday, Oct. 31, 2017  Enter through the [employer] [facility] at:  6:00AM  Pick up the phone at the desk and dial [phone].  Call this number if you have problems the morning of surgery: [phone].  Remember: Do NOT eat food or drink after:  Midnight Monday  Take these medicines the morning of surgery with a SIP OF WATER:  None  Stop ALL herbal medications at this time   Do NOT wear jewelry (body piercing), metal hair clips/bobby pins, make-up, or nail polish. Do NOT wear lotions, powders, or perfumes.  You may wear deodorant. Do NOT shave for 48 hours prior to surgery. Do NOT bring valuables to the hospital. Contacts, dentures, or bridgework may not be worn into surgery.  Leave suitcase in car.  After surgery it may be brought to your room.  For patients admitted to the hospital, checkout time is 11:00 AM the day of discharge.  Instrucciones:  Su cirugia esta programada para-( your procedure is scheduled on) :  Tuesday, Oct. 31, 2017   Entre por la entrada principal a la(s) -(enter through the main entrance at):   6:00AM  Goltry e informenos de su llegada ( pick up phone, dial [phone] on arrival)  Por favor llame al [phone] si tiene algun problema la [place] ( please call [phone] if you have any problems the morning of surgery.)  Recuerde: (Remember)  No coma alimentos ni tome liquidos, incluyendo agua, despues de la medianoche del  ( Do not eat food or drink liquids including water after midnight on Midnight Monday    [place] con un sorbito de agua (take these meds the morning of surgery with a SIP of water)  None   Puede cepillarse los dientes en la manana de la [place]. (you may brush your teeth the morning of surgery)  NO use joyas, maquillaje de ojos, lapiz labial, crema para el cuerpo o esmalte de unas oscuro - las unas de los pies  pueden estar pintados. ( Do not wear jewelry, eye makeup, lipstick, body lotion, or dark fingernail polish)  Puede usar desodorante ( you may wear deodorant)  Si va a ser ingresado despues de las [place], deje la [place] en el carro hasta que se le haya asignado una habitacion. ( If you are to be admitted after surgery, leave suitcase in car until your room has been assigned.)  A los pacientes que se les de de alta el mismo dia no se les permitira manejar a casa.  ( Patients discharged on the day of surgery will not be allowed to drive home)  Use ropa suelta y comoda de regreso a Manufacturing engineer. ( wear loose comfortable clothes for ride home)  [place] (patient signature) ______________________________________

## 2016-01-03 ENCOUNTER — Encounter (HOSPITAL_COMMUNITY): Payer: Self-pay

## 2016-01-03 ENCOUNTER — Encounter (HOSPITAL_COMMUNITY)
Admission: RE | Admit: 2016-01-03 | Discharge: 2016-01-03 | Disposition: A | Discharge status: Home / Self Care | Payer: Medicaid Other | Source: Ambulatory Visit | Attending: Obstetrics & Gynecology | Admitting: Obstetrics & Gynecology

## 2016-01-03 DIAGNOSIS — Z01818 Encounter for other preprocedural examination: Secondary | ICD-10-CM | POA: Diagnosis present

## 2016-01-03 HISTORY — DX: Carpal tunnel syndrome, bilateral upper limbs: G56.03

## 2016-01-03 HISTORY — DX: Abdominal distension (gaseous): R14.0

## 2016-01-03 HISTORY — DX: Anemia, unspecified: D64.9

## 2016-01-03 LAB — TYPE AND SCREEN
ABO/RH(D): A POS
Antibody Screen: NEGATIVE

## 2016-01-03 LAB — CBC
HEMATOCRIT: 40.1 % (ref 36.0–46.0)
HEMOGLOBIN: 14.1 g/dL (ref 12.0–15.0)
MCH: 27.6 pg (ref 26.0–34.0)
MCHC: 35.2 g/dL (ref 30.0–36.0)
MCV: 78.5 fL (ref 78.0–100.0)
Platelets: 237 10*3/uL (ref 150–400)
RBC: 5.11 MIL/uL (ref 3.87–5.11)
RDW: 14.2 % (ref 11.5–15.5)
WBC: 6.6 10*3/uL (ref 4.0–10.5)

## 2016-01-03 NOTE — Pre-Procedure Instructions (Signed)
Used language line for PAT appointment ID [phone]

## 2016-01-10 NOTE — Anesthesia Preprocedure Evaluation (Addendum)
Anesthesia Evaluation  Patient identified by MRN, date of birth, ID band Patient awake    Reviewed: Allergy & Precautions, NPO status , Patient's Chart, lab work & pertinent test results  History of Anesthesia Complications Negative for: history of anesthetic complications  Airway Mallampati: II  TM Distance: >3 FB Neck ROM: Full    Dental  (+) Partial Lower, Dental Advisory Given   Pulmonary neg pulmonary ROS, neg shortness of breath, neg sleep apnea, neg COPD, neg recent URI,    Pulmonary exam normal breath sounds clear to auscultation       Cardiovascular (-) anginanegative cardio ROS   Rhythm:Regular Rate:Normal     Neuro/Psych neg Seizures    GI/Hepatic negative GI ROS, Neg liver ROS,   Endo/Other  negative endocrine ROS  Renal/GU negative Renal ROS     Musculoskeletal   Abdominal (+) + obese,   Peds  Hematology  (+) Blood dyscrasia (Hgb 14.1), anemia ,   Anesthesia Other Findings fibroid  Reproductive/Obstetrics                            Anesthesia Physical Anesthesia Plan  ASA: II  Anesthesia Plan: General   Post-op Pain Management:    Induction: Intravenous  Airway Management Planned: Oral ETT  Additional Equipment:   Intra-op Plan:   Post-operative Plan: Extubation in OR  Informed Consent: I have reviewed the patients History and Physical, chart, labs and discussed the procedure including the risks, benefits and alternatives for the proposed anesthesia with the patient or authorized representative who has indicated his/her understanding and acceptance.   Dental advisory given  Plan Discussed with: CRNA  Anesthesia Plan Comments: (Risks of general anesthesia discussed including, but not limited to, sore throat, hoarse voice, chipped/damaged teeth, injury to vocal cords, nausea and vomiting, allergic reactions, lung infection, heart attack, stroke, and death. All  questions answered.  Consent obtained using a Spanish interpreter.)      Anesthesia Quick Evaluation

## 2016-01-11 ENCOUNTER — Inpatient Hospital Stay (HOSPITAL_COMMUNITY): Payer: Medicaid Other | Admitting: Anesthesiology

## 2016-01-11 ENCOUNTER — Inpatient Hospital Stay (HOSPITAL_COMMUNITY)
Admission: RE | Admit: 2016-01-11 | Discharge: 2016-01-13 | DRG: 743 | Disposition: A | Discharge status: Home / Self Care | Payer: Medicaid Other | Source: Ambulatory Visit | Attending: Obstetrics & Gynecology | Admitting: Obstetrics & Gynecology

## 2016-01-11 ENCOUNTER — Encounter (HOSPITAL_COMMUNITY): Payer: Self-pay

## 2016-01-11 ENCOUNTER — Encounter (HOSPITAL_COMMUNITY)
Admission: RE | Disposition: A | Discharge status: Home / Self Care | Payer: Self-pay | Source: Ambulatory Visit | Attending: Obstetrics & Gynecology

## 2016-01-11 DIAGNOSIS — N854 Malposition of uterus: Secondary | ICD-10-CM | POA: Diagnosis present

## 2016-01-11 DIAGNOSIS — Z9889 Other specified postprocedural states: Secondary | ICD-10-CM

## 2016-01-11 DIAGNOSIS — D250 Submucous leiomyoma of uterus: Secondary | ICD-10-CM | POA: Diagnosis present

## 2016-01-11 DIAGNOSIS — N920 Excessive and frequent menstruation with regular cycle: Secondary | ICD-10-CM | POA: Diagnosis present

## 2016-01-11 DIAGNOSIS — D251 Intramural leiomyoma of uterus: Secondary | ICD-10-CM | POA: Diagnosis present

## 2016-01-11 DIAGNOSIS — D259 Leiomyoma of uterus, unspecified: Secondary | ICD-10-CM | POA: Diagnosis present

## 2016-01-11 DIAGNOSIS — D649 Anemia, unspecified: Secondary | ICD-10-CM | POA: Diagnosis present

## 2016-01-11 HISTORY — PX: HYSTERECTOMY ABDOMINAL WITH SALPINGECTOMY: SHX6725

## 2016-01-11 LAB — PREGNANCY, URINE: Preg Test, Ur: NEGATIVE

## 2016-01-11 SURGERY — HYSTERECTOMY, TOTAL, ABDOMINAL, WITH SALPINGECTOMY
Anesthesia: General | Site: Abdomen | Laterality: Bilateral

## 2016-01-11 MED ORDER — ONDANSETRON HCL 4 MG/2ML IJ SOLN
INTRAMUSCULAR | Status: AC
  Filled 2016-01-11: qty 2

## 2016-01-11 MED ORDER — GLYCOPYRROLATE 0.2 MG/ML IJ SOLN
INTRAMUSCULAR | Status: AC
  Filled 2016-01-11: qty 2

## 2016-01-11 MED ORDER — MIDAZOLAM HCL 5 MG/5ML IJ SOLN
INTRAMUSCULAR | Status: DC | PRN
  Administered 2016-01-11 (×2): 1 mg via INTRAVENOUS

## 2016-01-11 MED ORDER — FENTANYL CITRATE (PF) 100 MCG/2ML IJ SOLN
INTRAMUSCULAR | Status: AC
  Administered 2016-01-11: 25 ug via INTRAVENOUS
  Filled 2016-01-11: qty 2

## 2016-01-11 MED ORDER — DEXAMETHASONE SODIUM PHOSPHATE 10 MG/ML IJ SOLN
INTRAMUSCULAR | Status: AC
  Filled 2016-01-11: qty 1

## 2016-01-11 MED ORDER — LIDOCAINE HCL (CARDIAC) 20 MG/ML IV SOLN
INTRAVENOUS | Status: AC
  Filled 2016-01-11: qty 5

## 2016-01-11 MED ORDER — SODIUM CHLORIDE 0.9 % IJ SOLN
INTRAMUSCULAR | Status: AC
  Filled 2016-01-11: qty 100

## 2016-01-11 MED ORDER — SCOPOLAMINE 1 MG/3DAYS TD PT72
MEDICATED_PATCH | TRANSDERMAL | Status: AC
Start: 2016-01-11 — End: 2016-01-11
  Filled 2016-01-11: qty 1

## 2016-01-11 MED ORDER — CEFAZOLIN SODIUM-DEXTROSE 2-4 GM/100ML-% IV SOLN
2.0000 g | INTRAVENOUS | Status: AC
  Administered 2016-01-11: 2 g via INTRAVENOUS

## 2016-01-11 MED ORDER — IBUPROFEN 800 MG PO TABS
800.0000 mg | ORAL_TABLET | Freq: Three times a day (TID) | ORAL | Status: DC | PRN
  Administered 2016-01-11 – 2016-01-13 (×3): 800 mg via ORAL
  Filled 2016-01-11 (×4): qty 1

## 2016-01-11 MED ORDER — PROMETHAZINE HCL 25 MG/ML IJ SOLN: 6.2500 mg | INTRAMUSCULAR | Status: DC | PRN

## 2016-01-11 MED ORDER — ROCURONIUM BROMIDE 100 MG/10ML IV SOLN
INTRAVENOUS | Status: DC | PRN
  Administered 2016-01-11: 45 mg via INTRAVENOUS
  Administered 2016-01-11: 5 mg via INTRAVENOUS

## 2016-01-11 MED ORDER — SCOPOLAMINE 1 MG/3DAYS TD PT72
1.0000 | MEDICATED_PATCH | Freq: Once | TRANSDERMAL | Status: DC
  Administered 2016-01-11: 1.5 mg via TRANSDERMAL

## 2016-01-11 MED ORDER — ONDANSETRON HCL 4 MG PO TABS: 4.0000 mg | ORAL_TABLET | Freq: Four times a day (QID) | ORAL | Status: DC | PRN

## 2016-01-11 MED ORDER — KETOROLAC TROMETHAMINE 30 MG/ML IJ SOLN
INTRAMUSCULAR | Status: DC | PRN
  Administered 2016-01-11: 30 mg via INTRAVENOUS

## 2016-01-11 MED ORDER — LACTATED RINGERS IV SOLN
INTRAVENOUS | Status: DC
  Administered 2016-01-11 (×4): via INTRAVENOUS

## 2016-01-11 MED ORDER — FENTANYL CITRATE (PF) 100 MCG/2ML IJ SOLN
INTRAMUSCULAR | Status: DC | PRN
  Administered 2016-01-11 (×4): 50 ug via INTRAVENOUS

## 2016-01-11 MED ORDER — BUPIVACAINE HCL (PF) 0.5 % IJ SOLN
INTRAMUSCULAR | Status: DC | PRN
  Administered 2016-01-11: 30 mL

## 2016-01-11 MED ORDER — FENTANYL CITRATE (PF) 100 MCG/2ML IJ SOLN
25.0000 ug | INTRAMUSCULAR | Status: DC | PRN
  Administered 2016-01-11: 50 ug via INTRAVENOUS
  Administered 2016-01-11 (×2): 25 ug via INTRAVENOUS

## 2016-01-11 MED ORDER — PROPOFOL 10 MG/ML IV BOLUS
INTRAVENOUS | Status: AC
  Filled 2016-01-11: qty 20

## 2016-01-11 MED ORDER — FENTANYL CITRATE (PF) 250 MCG/5ML IJ SOLN
INTRAMUSCULAR | Status: AC
  Filled 2016-01-11: qty 5

## 2016-01-11 MED ORDER — DEXAMETHASONE SODIUM PHOSPHATE 4 MG/ML IJ SOLN
INTRAMUSCULAR | Status: DC | PRN
  Administered 2016-01-11: 4 mg via INTRAVENOUS
  Administered 2016-01-11: 6 mg via INTRAVENOUS

## 2016-01-11 MED ORDER — KETOROLAC TROMETHAMINE 30 MG/ML IJ SOLN
INTRAMUSCULAR | Status: AC
  Filled 2016-01-11: qty 1

## 2016-01-11 MED ORDER — ROCURONIUM BROMIDE 100 MG/10ML IV SOLN
INTRAVENOUS | Status: AC
  Filled 2016-01-11: qty 1

## 2016-01-11 MED ORDER — NEOSTIGMINE METHYLSULFATE 10 MG/10ML IV SOLN
INTRAVENOUS | Status: DC | PRN
  Administered 2016-01-11: 2.5 mg via INTRAVENOUS

## 2016-01-11 MED ORDER — MIDAZOLAM HCL 2 MG/2ML IJ SOLN
INTRAMUSCULAR | Status: AC
  Filled 2016-01-11: qty 2

## 2016-01-11 MED ORDER — HYDROMORPHONE HCL 1 MG/ML IJ SOLN
INTRAMUSCULAR | Status: AC
  Administered 2016-01-11: 0.5 mg via INTRAVENOUS
  Filled 2016-01-11: qty 1

## 2016-01-11 MED ORDER — OXYCODONE-ACETAMINOPHEN 5-325 MG PO TABS
1.0000 | ORAL_TABLET | ORAL | Status: DC | PRN
Start: 2016-01-11 — End: 2016-01-13
  Administered 2016-01-11 (×3): 1 via ORAL
  Administered 2016-01-12: 2 via ORAL
  Administered 2016-01-12 (×2): 1 via ORAL
  Administered 2016-01-13 (×2): 2 via ORAL
  Filled 2016-01-11: qty 1
  Filled 2016-01-11: qty 2
  Filled 2016-01-11 (×3): qty 1
  Filled 2016-01-11 (×2): qty 2
  Filled 2016-01-11 (×2): qty 1

## 2016-01-11 MED ORDER — PROPOFOL 10 MG/ML IV BOLUS
INTRAVENOUS | Status: DC | PRN
  Administered 2016-01-11: 30 mg via INTRAVENOUS
  Administered 2016-01-11: 140 mg via INTRAVENOUS

## 2016-01-11 MED ORDER — HYDROMORPHONE HCL 1 MG/ML IJ SOLN
0.2500 mg | INTRAMUSCULAR | Status: DC | PRN
  Administered 2016-01-11 (×2): 0.5 mg via INTRAVENOUS

## 2016-01-11 MED ORDER — GLYCOPYRROLATE 0.2 MG/ML IJ SOLN
INTRAMUSCULAR | Status: DC | PRN
  Administered 2016-01-11: .5 mg via INTRAVENOUS

## 2016-01-11 MED ORDER — PHENYLEPHRINE 40 MCG/ML (10ML) SYRINGE FOR IV PUSH (FOR BLOOD PRESSURE SUPPORT)
PREFILLED_SYRINGE | INTRAVENOUS | Status: AC
Start: 2016-01-11 — End: 2016-01-11
  Filled 2016-01-11: qty 10

## 2016-01-11 MED ORDER — HYDROMORPHONE HCL 1 MG/ML IJ SOLN
0.2000 mg | INTRAMUSCULAR | Status: DC | PRN
Start: 2016-01-11 — End: 2016-01-13
  Administered 2016-01-12: 0.6 mg via INTRAVENOUS
  Filled 2016-01-11: qty 1

## 2016-01-11 MED ORDER — PHENYLEPHRINE HCL 10 MG/ML IJ SOLN
INTRAMUSCULAR | Status: DC | PRN
  Administered 2016-01-11: 40 ug via INTRAVENOUS

## 2016-01-11 MED ORDER — HYDROMORPHONE HCL 1 MG/ML IJ SOLN
INTRAMUSCULAR | Status: AC
  Filled 2016-01-11: qty 1

## 2016-01-11 MED ORDER — ONDANSETRON HCL 4 MG/2ML IJ SOLN
4.0000 mg | Freq: Four times a day (QID) | INTRAMUSCULAR | Status: DC | PRN
Start: 2016-01-11 — End: 2016-01-13
  Administered 2016-01-11 – 2016-01-12 (×2): 4 mg via INTRAVENOUS
  Filled 2016-01-11 (×2): qty 2

## 2016-01-11 MED ORDER — LACTATED RINGERS IV SOLN
INTRAVENOUS | Status: DC
  Administered 2016-01-11: 18:00:00 via INTRAVENOUS
  Administered 2016-01-12: 100 mL/h via INTRAVENOUS

## 2016-01-11 MED ORDER — BUPIVACAINE HCL (PF) 0.5 % IJ SOLN
INTRAMUSCULAR | Status: AC
  Filled 2016-01-11: qty 30

## 2016-01-11 MED ORDER — VASOPRESSIN 20 UNIT/ML IV SOLN
INTRAVENOUS | Status: AC
  Filled 2016-01-11: qty 1

## 2016-01-11 SURGICAL SUPPLY — 36 items
CANISTER SUCT 3000ML (MISCELLANEOUS) ×3 IMPLANT
CLOSURE WOUND 1/2 X4 (GAUZE/BANDAGES/DRESSINGS)
CLOTH BEACON ORANGE TIMEOUT ST (SAFETY) ×3 IMPLANT
CONT PATH 16OZ SNAP LID 3702 (MISCELLANEOUS) ×3 IMPLANT
DECANTER SPIKE VIAL GLASS SM (MISCELLANEOUS) ×3 IMPLANT
DRAPE CESAREAN BIRTH W POUCH (DRAPES) ×3 IMPLANT
DRAPE WARM FLUID 44X44 (DRAPE) ×3 IMPLANT
DRSG OPSITE POSTOP 4X10 (GAUZE/BANDAGES/DRESSINGS) ×3 IMPLANT
DURAPREP 26ML APPLICATOR (WOUND CARE) ×3 IMPLANT
GAUZE SPONGE 4X4 16PLY XRAY LF (GAUZE/BANDAGES/DRESSINGS) IMPLANT
GLOVE BIO SURGEON STRL SZ 6.5 (GLOVE) ×2 IMPLANT
GLOVE BIO SURGEONS STRL SZ 6.5 (GLOVE) ×1
GLOVE BIOGEL PI IND STRL 7.0 (GLOVE) ×3 IMPLANT
GLOVE BIOGEL PI INDICATOR 7.0 (GLOVE) ×6
GOWN STRL REUS W/TWL LRG LVL3 (GOWN DISPOSABLE) ×9 IMPLANT
HEMOSTAT ARISTA ABSORB 3G PWDR (MISCELLANEOUS) ×3 IMPLANT
NEEDLE SPNL 18GX3.5 QUINCKE PK (NEEDLE) ×3 IMPLANT
NS IRRIG 1000ML POUR BTL (IV SOLUTION) ×6 IMPLANT
PACK ABDOMINAL GYN (CUSTOM PROCEDURE TRAY) ×3 IMPLANT
PAD OB MATERNITY 4.3X12.25 (PERSONAL CARE ITEMS) ×3 IMPLANT
PENCIL SMOKE EVAC W/HOLSTER (ELECTROSURGICAL) ×3 IMPLANT
PROTECTOR NERVE ULNAR (MISCELLANEOUS) ×6 IMPLANT
SPONGE LAP 18X18 X RAY DECT (DISPOSABLE) ×3 IMPLANT
STRIP CLOSURE SKIN 1/2X4 (GAUZE/BANDAGES/DRESSINGS) IMPLANT
SUT CHROMIC 3 0 SH 27 (SUTURE) IMPLANT
SUT PDS AB 0 CTX 60 (SUTURE) ×3 IMPLANT
SUT VIC AB 0 CT1 27 (SUTURE) ×4
SUT VIC AB 0 CT1 27XBRD ANBCTR (SUTURE) ×2 IMPLANT
SUT VIC AB 0 CT1 36 (SUTURE) ×3 IMPLANT
SUT VIC AB 2-0 CT1 18 (SUTURE) ×9 IMPLANT
SUT VIC AB 3-0 CT1 27 (SUTURE) ×2
SUT VIC AB 3-0 CT1 TAPERPNT 27 (SUTURE) ×1 IMPLANT
SYR 30ML LL (SYRINGE) ×3 IMPLANT
TOWEL OR 17X24 6PK STRL BLUE (TOWEL DISPOSABLE) ×6 IMPLANT
TRAY FOLEY CATH SILVER 14FR (SET/KITS/TRAYS/PACK) ×3 IMPLANT
WATER STERILE IRR 1000ML POUR (IV SOLUTION) IMPLANT

## 2016-01-11 NOTE — Op Note (Signed)
01/11/2016  8:51 AM  PATIENT:  Sonya Gutierrez  45 y.o. female  PRE-OPERATIVE DIAGNOSIS:   Fibroids, menorrhagia and anemia  POST-OPERATIVE DIAGNOSIS:   Fibroids, menorrhagia and anemia  PROCEDURE:  Procedure(s): HYSTERECTOMY ABDOMINAL WITH SALPINGECTOMY (Bilateral)  SURGEON:  Surgeon(s) and Role:    * Galdino Hinchman Marijo Sanes, MD - Primary    * Lavonia Drafts, MD - Assisting    ASSISTANTS: Gilberto Better, MS3   ANESTHESIA:   general  EBL:  Total I/O In: 1000 [I.V.:1000] Out: 450 [Urine:150; Blood:300]  BLOOD ADMINISTERED:none  DRAINS: none   LOCAL MEDICATIONS USED:  MARCAINE     SPECIMEN:  Source of Specimen:  uterus and tubes  DISPOSITION OF SPECIMEN:  Source of Specimen:  uterus and tubes  COUNTS:  YES  TOURNIQUET:  * No tourniquets in log *  DICTATION: .Dragon Dictation  PLAN OF CARE: Admit to inpatient   PATIENT DISPOSITION:  PACU - hemodynamically stable.   Delay start of Pharmacological VTE agent (>24hrs) due to surgical blood loss or risk of bleeding: not applicable     The risks, benefits, and alternatives of surgery were explained, understood, and accepted. Consents were signed. All questions were answered. She was taken to the operating room and general anesthesia was applied without complication. Her abdomen and vagina were prepped and draped in the usual sterile fashion. A Foley catheter was placed which drained clear urine throughout the case. A transverse incision was made approximately 2 cm above her symphysis pubis after injecting 30 mL of 0.5% marcaine in the subcutaneous tissue. The incision was carried down through the subcutaneous tissue to the fascia. Bleeding encountered was cauterized with the Bovie. The fascia was scored the midline and the fascial incision was extended bilaterally. The pyramidalis muscles were separated in a transverse fashion using electrosurgical technique. Approximately 2 cm of the rectus muscles were separated in a  transverse fashion in the midline using electrosurgical technique. Hemostasis was maintained. The peritoneum was entered with hemostats and the peritoneal incision was extended bilaterally with the Bovie, taking care to avoid bowel and bladder. The patient was placed in Trendelenburg position and her bowel was packed out of the abdominal cavity. The pelvis was inspected. Her uterus was enlarged but the ovaries were normal. The oviducts were somewhat enlarged but no obvious abnormalities. Coker clamps were used to elevate the uterus. The round ligaments were identified clamped cut and ligated. A bladder flap was created anteriorly and the bladder was pushed out of the operative site with a moist lap sponge. The uteroovarian ligaments were identified bilaterally. They were clamped, cut, and ligated. Excellent hemostasis was noted. 2-0 Vicryl sutures used throughout this case unless otherwise specified. The uterine vessels were skeletonized, clamped, cut, and doubly ligated. A bladder flap was created anteriorly. The remainder of the cervix was separated from its pelvic attachments using the same clamp, cut, ligate technique. Curved Heaney clamps were used to clamp beneath the cervix. The cervix and uterus were removed and sent to pathology. The vaginal cuff was noted to be hemostatic after closing it with running locking suture. The pelvis was irrigated with 1 L of warm normal saline. There was a small amount of oozing from the right adnexal pedicle. The peritoneum was oversewn there.  All pedicles were noted to be hemostatic. Arista was placed over the cuff and ovaries. The ureters were noted to be functioning and of normal caliber. The sponges were removed from the pelvis. The rectus muscles were inspected and hemostasis was assured.  The fascia was closed with a 0 Vicryl running nonlocking suture. The subcutaneous tissue was irrigated, clean, dry.  A subcuticular closure was done with 3-0 vicryl suture. She  tolerated the procedure well and was taken to the recovery room in stable condition. Her Foley catheter drained clear urine at the beginning and end of the case. There was a brief time towards the end of the case when there was a pink tint to the clear urine.

## 2016-01-11 NOTE — Anesthesia Procedure Notes (Signed)
Procedure Name: Intubation Date/Time: 01/11/2016 7:37 AM Performed by: Vernice Jefferson Pre-anesthesia Checklist: Patient identified, Emergency Drugs available, Suction available, Patient being monitored and Timeout performed Patient Re-evaluated:Patient Re-evaluated prior to inductionOxygen Delivery Method: Circle system utilized Preoxygenation: Pre-oxygenation with 100% oxygen Intubation Type: IV induction Ventilation: Mask ventilation without difficulty Laryngoscope Size: Mac and 3 Grade View: Grade II Tube type: Oral Tube size: 7.0 mm Number of attempts: 1 Airway Equipment and Method: Stylet Placement Confirmation: ETT inserted through vocal cords under direct vision,  positive ETCO2 and breath sounds checked- equal and bilateral Secured at: 20 (at teeth) cm Tube secured with: Tape Dental Injury: Teeth and Oropharynx as per pre-operative assessment

## 2016-01-11 NOTE — Anesthesia Procedure Notes (Signed)
Performed by: Dashel Goines W       

## 2016-01-11 NOTE — Addendum Note (Signed)
Addendum  created 01/11/16 1350 by Hewitt Blade, CRNA   Sign clinical note

## 2016-01-11 NOTE — Progress Notes (Signed)
Check on patients need, I ordered her meals, by Juliann Mule Spanish Interpreter.

## 2016-01-11 NOTE — H&P (Signed)
HPI: Patient is a 45 year old gravida 5 para 4 (normal spontaneous vaginal delivery/BTL) 1 miscarriage resulting D&C several years ago. Patient is here today because of her history of leiomyomatous uteri and anemia. Patient had been originally followed at the [facility] where they had diagnosed her with severe anemia where her hemoglobin was as low as 7.4 and was started on supplemental iron and they had given her also medroxyprogesterone 5 mg by mouth daily. At that office the also did an endometrial biopsy which I have a copy of the report which stated that there was no hyperplasia or any evidence of malignancy only proliferative endometrium. She then was admitted through the emergency room on May 9 because of her severe anemia whereby her hemoglobin had dropped to 5.3 and while in the hospital she was transfused 4 units of packed red blood cell and was discharged home with a hemoglobin of 10.8. She's currently on Megace 40 mg 3 times a day and iron supplementation 1 twice a day. She is complaining of no bleeding today runs discharge on and off at times but no further heavy bleeding while on this regimen. She does feel tired and weak at times. Patient with no past history of any abnormal Pap smear her last Pap smear was in 2015 which was normal.Measurements: 11.7 x 7.2 x 9.5 cm. The anteverted uterus is moderately enlarged. There is diffuse heterogeneity of the myometrium with indistinct endometrial myometrial interface and scattered tiny myometrial cysts, consistent with underlying diffuse adenomyosis of the uterus. There are several uterine fibroids, with representative uterine fibroids as follows:  Her u/s showed the following:    Patient's last menstrual period was 12/13/2015 (exact date).    Past Medical History:  Diagnosis Date  . Abdominal bloating    after eating  . Anemia   . Bilateral carpal tunnel syndrome   . Corneal abrasion   . Fibroid     Past  Surgical History:  Procedure Laterality Date  . HAND SURGERY Right   . TUBAL LIGATION      History reviewed. No pertinent family history.  Social History:  reports that she has never smoked. She has never used smokeless tobacco. She reports that she does not drink alcohol or use drugs.  Allergies: No Known Allergies  Prescriptions Prior to Admission  Medication Sig Dispense Refill Last Dose  . calcium acetate (PHOSLO) 667 MG capsule Take by mouth 3 (three) times daily with meals.   Past Week at Unknown time  . calcium carbonate 1250 MG capsule Take 1,250 mg by mouth 2 (two) times daily with a meal.   Past Week at Unknown time  . ibuprofen (ADVIL,MOTRIN) 200 MG tablet Take 400 mg by mouth every 6 (six) hours as needed for headache, mild pain or moderate pain.    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 rate 16, last menstrual period 12/13/2015, SpO2 100 %. Physical Exam  Heart- rrr Lungs- CTAB Abd- moderately obese, uterus palpable to U-3  Results for orders placed or performed during the hospital encounter of 01/11/16 (from the past 24 hour(s))  Pregnancy, urine     Status: None   Collection Time: 01/11/16  6:00 AM  Result Value Ref Range   Preg Test, Ur NEGATIVE NEGATIVE    No results found.  Assessment/Plan: Symptomatic fibroids- plan for TAH/BS  She  understands the risks of surgery, including, but not to infection, bleeding, DVTs, damage to bowel, bladder, ureters. She wishes to proceed.     Emily Filbert 01/11/2016, 6:58 AM

## 2016-01-11 NOTE — Transfer of Care (Signed)
Immediate Anesthesia Transfer of Care Note  Patient: Sonya Gutierrez  Procedure(s) Performed: Procedure(s): HYSTERECTOMY ABDOMINAL WITH SALPINGECTOMY (Bilateral)  Patient Location: PACU  Anesthesia Type:General  Level of Consciousness: sedated and patient cooperative  Airway & Oxygen Therapy: Patient Spontanous Breathing and Patient connected to nasal cannula oxygen  Post-op Assessment: Report given to RN and Post -op Vital signs reviewed and stable  Post vital signs: Reviewed and stable  Last Vitals:  Vitals:   01/11/16 0615  BP: 108/70  Pulse: 73  Resp: 16  Temp: 36.7 C    Last Pain:  Vitals:   01/11/16 0615  TempSrc: Oral      Patients Stated Pain Goal: 3 ([id number])  Complications: No apparent anesthesia complications

## 2016-01-11 NOTE — Anesthesia Postprocedure Evaluation (Signed)
Anesthesia Post Note  Patient: Sonya Gutierrez  Procedure(s) Performed: Procedure(s) (LRB): HYSTERECTOMY ABDOMINAL WITH SALPINGECTOMY (Bilateral)  Patient location during evaluation: [facility] Anesthesia Type: General Level of consciousness: awake and alert Pain management: pain level controlled Vital Signs Assessment: post-procedure vital signs reviewed and stable Respiratory status: spontaneous breathing and nonlabored ventilation Cardiovascular status: stable Postop Assessment: adequate PO intake and no signs of nausea or vomiting Anesthetic complications: no     Last Vitals:  Vitals:   01/11/16 1023 01/11/16 1123  BP: 115/64 (!) 104/59  Pulse: 67 78  Resp:    Temp: 36.9 C 36.7 C    Last Pain:  Vitals:   01/11/16 1200  TempSrc:   PainSc: 6    Pain Goal: Patients Stated Pain Goal: 3 (01/11/16 1034)               Sheridyn Canino Hristova

## 2016-01-11 NOTE — Anesthesia Postprocedure Evaluation (Signed)
Anesthesia Post Note  Patient: Sonya Gutierrez  Procedure(s) Performed: Procedure(s) (LRB): HYSTERECTOMY ABDOMINAL WITH SALPINGECTOMY (Bilateral)  Patient location during evaluation: PACU Anesthesia Type: General Level of consciousness: awake and alert Pain management: pain level controlled Vital Signs Assessment: post-procedure vital signs reviewed and stable Respiratory status: spontaneous breathing, nonlabored ventilation and respiratory function stable Cardiovascular status: blood pressure returned to baseline and stable Postop Assessment: no signs of nausea or vomiting Anesthetic complications: no     Last Vitals:  Vitals:   01/11/16 0900 01/11/16 1000  BP:    Pulse:    Resp:    Temp: 36.4 C 36.6 C    Last Pain:  Vitals:   01/11/16 0900  TempSrc:   PainSc: Asleep   Pain Goal: Patients Stated Pain Goal: 3 (01/11/16 0615)               Nilda Simmer

## 2016-01-12 ENCOUNTER — Encounter (HOSPITAL_COMMUNITY): Payer: Self-pay | Admitting: Obstetrics & Gynecology

## 2016-01-12 DIAGNOSIS — D259 Leiomyoma of uterus, unspecified: Secondary | ICD-10-CM | POA: Diagnosis not present

## 2016-01-12 DIAGNOSIS — N920 Excessive and frequent menstruation with regular cycle: Secondary | ICD-10-CM

## 2016-01-12 DIAGNOSIS — D649 Anemia, unspecified: Secondary | ICD-10-CM

## 2016-01-12 LAB — CBC
HEMATOCRIT: 27.5 % — AB (ref 36.0–46.0)
Hemoglobin: 9.8 g/dL — ABNORMAL LOW (ref 12.0–15.0)
MCH: 28 pg (ref 26.0–34.0)
MCHC: 35.6 g/dL (ref 30.0–36.0)
MCV: 78.6 fL (ref 78.0–100.0)
Platelets: 179 10*3/uL (ref 150–400)
RBC: 3.5 MIL/uL — ABNORMAL LOW (ref 3.87–5.11)
RDW: 14.2 % (ref 11.5–15.5)
WBC: 11 10*3/uL — ABNORMAL HIGH (ref 4.0–10.5)

## 2016-01-12 NOTE — Progress Notes (Signed)
Patient started vomiting directly after swallowing  percocet for pain. Medicine was in vomit

## 2016-01-12 NOTE — Progress Notes (Signed)
1 Day Post-Op Procedure(s) (LRB): HYSTERECTOMY ABDOMINAL WITH SALPINGECTOMY (Bilateral)  Subjective: Patient reports tolerating PO and no problems voiding.  She is ambulating but no flatus yet.  Objective: I have reviewed patient's vital signs, intake and output, medications and labs.  General: alert Resp: clear to auscultation bilaterally Cardio: regular rate and rhythm, S1, S2 normal, no murmur, click, rub or gallop GI: soft, non-tender; bowel sounds normal; no masses,  no organomegaly  Dressing: c/d/i  Assessment: s/p Procedure(s): HYSTERECTOMY ABDOMINAL WITH SALPINGECTOMY (Bilateral): progressing well  Plan: Encourage ambulation  LOS: 1 day    [place] 01/12/2016, 11:20 AM

## 2016-01-13 LAB — CBC
HEMATOCRIT: 27.2 % — AB (ref 36.0–46.0)
HEMOGLOBIN: 9.4 g/dL — AB (ref 12.0–15.0)
MCH: 27.4 pg (ref 26.0–34.0)
MCHC: 34.6 g/dL (ref 30.0–36.0)
MCV: 79.3 fL (ref 78.0–100.0)
Platelets: 159 10*3/uL (ref 150–400)
RBC: 3.43 MIL/uL — ABNORMAL LOW (ref 3.87–5.11)
RDW: 14.6 % (ref 11.5–15.5)
WBC: 7.2 10*3/uL (ref 4.0–10.5)

## 2016-01-13 MED ORDER — OXYCODONE-ACETAMINOPHEN 5-325 MG PO TABS: 1.0000 | ORAL_TABLET | ORAL | 0 refills | Status: DC | PRN

## 2016-01-13 MED ORDER — IBUPROFEN 600 MG PO TABS: 600.0000 mg | ORAL_TABLET | Freq: Four times a day (QID) | ORAL | 1 refills | Status: AC | PRN

## 2016-01-13 MED ORDER — IBUPROFEN 800 MG PO TABS: 800.0000 mg | ORAL_TABLET | Freq: Three times a day (TID) | ORAL | 0 refills | Status: AC | PRN

## 2016-01-13 NOTE — Discharge Instructions (Signed)
Histerectoma abdominal, cuidados posteriores (Abdominal Hysterectomy, Care After) Siga estas instrucciones durante las prximas semanas. Estas indicaciones le proporcionan informacin general acerca de cmo deber cuidarse despus del procedimiento. El mdico tambin podr darle instrucciones ms especficas. El tratamiento se ha planificado de acuerdo a las prcticas mdicas actuales, pero a veces se producen problemas. Comunquese con el mdico si tiene algn problema o tiene dudas despus del procedimiento.  QU ESPERAR DESPUS DEL PROCEDIMIENTO Despus del procedimiento, es normal tener los siguientes sntomas:  Dolor.  Cansancio.  Prdida del apetito.  Menos inters sexual. La recuperacin de esta ciruga demora de 4 a 6 semanas.  [place] los medicamentos para Clinical biochemist como se lo indic el mdico. No tome medicamentos de venta libre para Glass blower/designer sin consultar antes al mdico.  Cambie el vendaje como se lo indic el mdico.  Debe ver nuevamente al mdico para que le saque las suturas.  Tome duchas en lugar de baos durante 2 a 3 semanas. Pregntele al mdico cundo es seguro comenzar a tomar duchas.  No se haga duchas vaginales, no use tampones ni tenga relaciones sexuales durante al menos 6semanas o hasta que el mdico la autorice.  Siga las indicaciones del mdico con respecto a la actividad fsica, a Surveyor, quantity, a conducir el automvil y a las actividades en general.  Descanse y duerma lo suficiente.  No levante nada que sea ms pesado que un galn de Air traffic controller (alrededor de 10lb [4.5kg]) durante el primer mes posterior a la Leisure centre manager.  Puede retomar su dieta normal si el mdico la autoriza.  No beba alcohol hasta que el mdico se lo autorice.  Si tiene estreimiento, pregntele al mdico si puede tomar un laxante suave.  Los alimentos con alto contenido de [place] tambin pueden tener un efecto laxante.  Coma muchas frutas y vegetales crudos, cereales integrales y frijoles.  Beba suficiente lquido para Consulting civil engineer orina clara o de color amarillo plido.  Trate de que haya alguna persona en su casa para ayudarla con las tareas del hogar durante 1 a 2 semanas despus de la [place].  Cumpla con todas las visitas de control. SOLICITE ATENCIN MDICA SI:   Siente escalofros o tiene fiebre.  Tiene sntomas de hinchazn, enrojecimiento o dolor en el rea de la incisin que estn empeorando.  Tiene pus en el lugar de la incisin.  Advierte un olor ftido que proviene de la incisin o del vendaje.  La herida se abre.  Se siente mareada o sufre un desmayo.  Siente dolor o tiene una hemorragia al [employer].  Tiene diarrea persistente.  Tiene nuseas o vmitos persistentes.  Tiene flujo vaginal anormal.  Tiene una erupcin cutnea.  Tiene alguna reaccin anormal o aparece una alergia por los medicamentos.  El medicamento no Production designer, theatre/television/film. SOLICITE ATENCIN MDICA DE INMEDIATO SI:   Tiene fiebre y los sntomas empeoran repentinamente.  Siente un dolor abdominal intenso.  Siente dolor en el pecho.  Le falta el aire.  Se desmaya.  Siente dolor, u observa hinchazn o enrojecimiento en la pierna.  Tiene una hemorragia vaginal abundante, con cogulos. ASEGRESE DE QUE:  Comprende estas instrucciones.  Controlar su afeccin.  Recibir ayuda de inmediato si no mejora o si empeora.   Esta informacin no tiene Marine scientist el consejo del mdico. Asegrese de hacerle al mdico cualquier pregunta que tenga.   Document Released: 09/10/2006 Document Revised: 03/20/2014 Elsevier Interactive Patient Education  Education ©2016 Elsevier Inc. ° °

## 2016-01-13 NOTE — Progress Notes (Signed)
Discharge instructions complete with interpreter in the room.  Patient verbalized understanding. IV taken out and tolerated well.

## 2016-01-13 NOTE — Discharge Summary (Signed)
Physician Discharge Summary  Patient ID: Sonya Gutierrez MRN: [id number] DOB/AGE: [birth year] 45 y.o.  Admit date: 01/11/2016 Discharge date: 01/13/2016  Admission Diagnoses: symptomatic fibroids  Discharge Diagnoses: same Active Problems:   Post-operative state   Discharged Condition: good  Hospital Course: She had an uncomplicated TAH/BS. By POD# 1 she was ambulating, voiding, and tolerating po without nausea/vomitting. Her Hbg was 9.8.  By POD #2 her Hbg was 9.4. She had normal vitals throughout her stay.  Consults: None  Significant Diagnostic Studies: labs: as above  Treatments: surgery: TAH/BS  Discharge Exam: Blood pressure 96/74, pulse 96, temperature 98.8 F (37.1 C), temperature source Oral, resp. rate 18, height 4\' 11"  (1.499 m), weight 73.3 kg (161 lb 8 oz), last menstrual period 12/13/2015, SpO2 96 %. General appearance: alert Resp: clear to auscultation bilaterally Cardio: regular rate and rhythm, S1, S2 normal, no murmur, click, rub or gallop GI: soft, non-tender; bowel sounds normal; no masses,  no organomegaly Incision/Wound: Dressing: c/d/i  Disposition: 01-Home or Self Care     Medication List    TAKE these medications   calcium acetate 667 MG capsule Commonly known as:  PHOSLO Take by mouth 3 (three) times daily with meals.   calcium carbonate 1250 MG capsule Take 1,250 mg by mouth 2 (two) times daily with a meal.   ibuprofen 200 MG tablet Commonly known as:  ADVIL,MOTRIN Take 400 mg by mouth every 6 (six) hours as needed for headache, mild pain or moderate pain. What changed:  Another medication with the same name was added. Make sure you understand how and when to take each.   ibuprofen 800 MG tablet Commonly known as:  ADVIL,MOTRIN Take 1 tablet (800 mg total) by mouth every 8 (eight) hours as needed (mild pain). What changed:  You were already taking a medication with the same name, and this prescription was added. Make sure you understand  how and when to take each.   ibuprofen 600 MG tablet Commonly known as:  ADVIL,MOTRIN Take 1 tablet (600 mg total) by mouth every 6 (six) hours as needed. What changed:  You were already taking a medication with the same name, and this prescription was added. Make sure you understand how and when to take each.   oxyCODONE-acetaminophen 5-325 MG tablet Commonly known as:  PERCOCET/ROXICET Take 1-2 tablets by mouth every 4 (four) hours as needed for severe pain (moderate to severe pain (when tolerating fluids)).      Follow-up Information    Emily Filbert, MD. Schedule an appointment as soon as possible for a visit in 6 week(s).   Specialty:  Obstetrics and Gynecology Contact information: [place] [address] [phone]           Signed: Emily Filbert 01/13/2016, 11:22 AM

## 2016-01-22 ENCOUNTER — Encounter (HOSPITAL_COMMUNITY): Payer: Self-pay

## 2016-01-22 ENCOUNTER — Inpatient Hospital Stay (HOSPITAL_COMMUNITY)
Admission: AD | Admit: 2016-01-22 | Discharge: 2016-01-22 | Disposition: A | Discharge status: Home / Self Care | Payer: Medicaid Other | Source: Ambulatory Visit | Attending: Obstetrics and Gynecology | Admitting: Obstetrics and Gynecology

## 2016-01-22 DIAGNOSIS — K5903 Drug induced constipation: Secondary | ICD-10-CM

## 2016-01-22 DIAGNOSIS — R300 Dysuria: Secondary | ICD-10-CM | POA: Insufficient documentation

## 2016-01-22 DIAGNOSIS — Z9889 Other specified postprocedural states: Secondary | ICD-10-CM | POA: Diagnosis not present

## 2016-01-22 DIAGNOSIS — R109 Unspecified abdominal pain: Secondary | ICD-10-CM | POA: Diagnosis not present

## 2016-01-22 DIAGNOSIS — K5900 Constipation, unspecified: Secondary | ICD-10-CM | POA: Diagnosis not present

## 2016-01-22 LAB — URINE MICROSCOPIC-ADD ON: Bacteria, UA: NONE SEEN

## 2016-01-22 LAB — URINALYSIS, ROUTINE W REFLEX MICROSCOPIC
BILIRUBIN URINE: NEGATIVE
GLUCOSE, UA: NEGATIVE mg/dL
KETONES UR: NEGATIVE mg/dL
LEUKOCYTES UA: NEGATIVE
Nitrite: NEGATIVE
PH: 6 (ref 5.0–8.0)
Protein, ur: NEGATIVE mg/dL
Specific Gravity, Urine: <1.005 — ABNORMAL LOW (ref 1.005–1.030)

## 2016-01-22 MED ORDER — DOCUSATE SODIUM 250 MG PO CAPS: 250.0000 mg | ORAL_CAPSULE | Freq: Every day | ORAL | 0 refills | Status: DC

## 2016-01-22 MED ORDER — IBUPROFEN 800 MG PO TABS: 800.0000 mg | ORAL_TABLET | Freq: Three times a day (TID) | ORAL | 0 refills | Status: DC

## 2016-01-22 MED ORDER — PHENAZOPYRIDINE HCL 200 MG PO TABS: 200.0000 mg | ORAL_TABLET | Freq: Three times a day (TID) | ORAL | 0 refills | Status: DC

## 2016-01-22 NOTE — Discharge Instructions (Signed)
Estreimiento - Adultos (Constipation, Adult) Estreimiento significa que una persona tiene menos de tres evacuaciones en una semana, dificultad para defecar, o que las heces son secas, duras, o ms grandes que lo normal. A medida que envejecemos el estreimiento es ms comn. Una dieta baja en fibra, no tomar suficientes lquidos y el uso de ciertos medicamentos pueden empeorar el estreimiento.  CAUSAS   Ciertos medicamentos, como los antidepresivos, analgsicos, suplementos de hierro, anticidos y diurticos.  Algunas enfermedades, como la diabetes, el sndrome del colon irritable, enfermedad de la tiroides, o depresin.  No beber suficiente agua.  No consumir suficientes alimentos ricos en fibra.  Situaciones de estrs o viajes.  Falta de actividad fsica o de ejercicio.  Ignorar la necesidad sbita de defecar.  Uso en exceso de laxantes. SIGNOS Y SNTOMAS   Defecar menos de tres veces por semana.  Dificultad para defecar.  Tener las heces secas y duras, o ms grandes que las normales.  Sensacin de estar lleno o hinchado.  Dolor en la parte baja del abdomen.  No sentir alivio despus de defecar. DIAGNSTICO  El mdico le har una historia clnica y un examen fsico. Pueden hacerle exmenes adicionales para el estreimiento grave. Estos estudios pueden ser:  Un radiografa con enema de bario para examinar el recto, el colon y, en algunos casos, el intestino delgado.  Una sigmoidoscopia para examinar el colon inferior.  Una colonoscopia para examinar todo el colon. TRATAMIENTO  El tratamiento depender de la gravedad del estreimiento y de la causa. Algunos tratamientos nutricionales son beber ms lquidos y comer ms alimentos ricos en fibra. El cambio en el estilo de vida incluye hacer ejercicios de manera regular. Si estas recomendaciones para realizar cambios en la dieta y en el estilo de vida no ayudan, el mdico le puede indicar el uso de laxantes de venta libre  para ayudarlo a defecar. Los medicamentos recetados se pueden prescribir si los medicamentos de venta libre no lo ayudan.  INSTRUCCIONES PARA EL CUIDADO EN EL HOGAR   Consuma alimentos con alto contenido de fibra, como frutas, vegetales, cereales integrales y porotos.  Limite los alimentos procesados ricos en grasas y azcar, como las papas fritas, hamburguesas, galletas, dulces y refrescos.  Puede agregar un suplemento de fibra a su dieta si no obtiene lo suficiente de los alimentos.  Beba suficiente lquido para mantener la orina clara o de color amarillo plido.  Haga ejercicio regularmente o segn las indicaciones del mdico.  Vaya al bao cuando sienta la necesidad de ir. No se aguante las ganas.  Tome solo medicamentos de venta libre o recetados, segn las indicaciones del mdico. No tome otros medicamentos para el estreimiento sin consultarlo antes con su mdico. SOLICITE ATENCIN MDICA DE INMEDIATO SI:   Observa sangre brillante en las heces.  El estreimiento dura ms de 4 das o empeora.  Siente dolor abdominal o rectal.  Las heces son delgadas como un lpiz.  Pierde peso de manera inexplicable. ASEGRESE DE QUE:   Comprende estas instrucciones.  Controlar su afeccin.  Recibir ayuda de inmediato si no mejora o si empeora.   Esta informacin no tiene como fin reemplazar el consejo del mdico. Asegrese de hacerle al mdico cualquier pregunta que tenga.   Document Released: 03/19/2007 Document Revised: 03/20/2014 Elsevier Interactive Patient Education 2016 [employer] Inc.  

## 2016-01-22 NOTE — MAU Provider Note (Signed)
History   45 yo HF post op of 10/31 for abd hyst in with abd pain constipation and burning with urination that has been going on for several days...  CSN: [id number]  Arrival date & time 01/22/16  1254   None     Chief Complaint  Patient presents with  . Abdominal Pain    HPI  Past Medical History:  Diagnosis Date  . Abdominal bloating    after eating  . Anemia   . Bilateral carpal tunnel syndrome   . Corneal abrasion   . Fibroid     Past Surgical History:  Procedure Laterality Date  . ABDOMINAL HYSTERECTOMY    . HAND SURGERY Right   . HYSTERECTOMY ABDOMINAL WITH SALPINGECTOMY Bilateral 01/11/2016   Procedure: HYSTERECTOMY ABDOMINAL WITH SALPINGECTOMY;  Surgeon: Emily Filbert, MD;  Location: [facility];  Service: Gynecology;  Laterality: Bilateral;  . TUBAL LIGATION      History reviewed. No pertinent family history.  Social History  Substance Use Topics  . Smoking status: Never Smoker  . Smokeless tobacco: Never Used  . Alcohol use No    OB History    Gravida Para Term Preterm AB Living   5 4     1 4    SAB TAB Ectopic Multiple Live Births   1              Review of Systems  Constitutional: Negative.   HENT: Negative.   Eyes: Negative.   Respiratory: Negative.   Cardiovascular: Negative.   Gastrointestinal: Positive for abdominal pain and constipation.  Genitourinary: Positive for dysuria and frequency.  Musculoskeletal: Negative.   Skin: Negative.   Neurological: Negative.   Hematological: Negative.   Psychiatric/Behavioral: Negative.     Allergies  Patient has no known allergies.  Home Medications    BP 115/75 (BP Location: Right Arm)   Pulse 91   Temp 98.2 F (36.8 C)   Resp 18   Ht 4\' 11"  (1.499 m)   Wt 159 lb 0.6 oz (72.1 kg)   LMP 12/13/2015 (Exact Date)   BMI 32.12 kg/m   Physical Exam  Constitutional: She is oriented to person, place, and time. She appears well-developed and well-nourished.  HENT:  Head: Normocephalic.  Eyes:  Pupils are equal, round, and reactive to light.  Neck: Normal range of motion.  Cardiovascular: Normal rate, regular rhythm, normal heart sounds and intact distal pulses.   Pulmonary/Chest: Effort normal and breath sounds normal.  Abdominal: Soft. Bowel sounds are normal.  Musculoskeletal: Normal range of motion.  Neurological: She is alert and oriented to person, place, and time. She has normal reflexes.  Skin: Skin is warm and dry.  Psychiatric: She has a normal mood and affect. Her behavior is normal. Thought content normal.    MAU Course  Procedures (including critical care time)  Labs Reviewed  URINALYSIS, ROUTINE W REFLEX MICROSCOPIC (NOT AT [facility]) - Abnormal; Notable for the following:       Result Value   Specific Gravity, Urine <1.005 (*)    Hgb urine dipstick LARGE (*)    All other components within normal limits  URINE MICROSCOPIC-ADD ON - Abnormal; Notable for the following:    Squamous Epithelial / LPF 0-5 (*)    All other components within normal limits   No results found.   No diagnosis found.  Drug induced constipation  MDM   low transverse incision intact, healing well no redness swelling or drainage.  Lengthy discussion on how narcotics can  constipate. Measures to take to aleviate. To increase H2O intake. Decrease narcotics.

## 2016-01-22 NOTE — MAU Note (Signed)
Patient had abdominal hysterectomy on 01/11/16, patient presents with abdominal pain and burning with urination, pain with bowel movements reports being constipation LBM this morning,

## 2016-01-28 ENCOUNTER — Encounter: Payer: Self-pay | Admitting: *Deleted

## 2016-02-24 ENCOUNTER — Ambulatory Visit: Payer: Medicaid Other | Admitting: Obstetrics & Gynecology

## 2016-03-10 ENCOUNTER — Encounter: Payer: Self-pay | Admitting: Obstetrics & Gynecology

## 2016-03-10 ENCOUNTER — Ambulatory Visit (INDEPENDENT_AMBULATORY_CARE_PROVIDER_SITE_OTHER): Payer: Self-pay | Admitting: Obstetrics & Gynecology

## 2016-03-10 VITALS — BP 119/82 | HR 89 | Wt 165.1 lb

## 2016-03-10 DIAGNOSIS — R8290 Unspecified abnormal findings in urine: Secondary | ICD-10-CM

## 2016-03-10 DIAGNOSIS — Z9889 Other specified postprocedural states: Secondary | ICD-10-CM

## 2016-03-10 LAB — POCT URINALYSIS DIP (DEVICE)
Bilirubin Urine: NEGATIVE
GLUCOSE, UA: NEGATIVE mg/dL
Hgb urine dipstick: NEGATIVE
Nitrite: NEGATIVE
PROTEIN: NEGATIVE mg/dL
Specific Gravity, Urine: 1.025 (ref 1.005–1.030)
UROBILINOGEN UA: 0.2 mg/dL (ref 0.0–1.0)
pH: 6.5 (ref 5.0–8.0)

## 2016-03-10 MED ORDER — SULFAMETHOXAZOLE-TRIMETHOPRIM 800-160 MG PO TABS: 1.0000 | ORAL_TABLET | Freq: Two times a day (BID) | ORAL | 2 refills | Status: DC

## 2016-03-10 NOTE — Progress Notes (Signed)
   Subjective:    Patient ID: Sonya Gutierrez, female    DOB: [birth year], 45 y.o.   MRN: [id number]  HPI 45 yo H lady is here for her 6 week post op visit after a TAH/BS for fibroids and anemia. She had sex about 3 days ago with no problems. She does have some dysuria.   Review of Systems     Objective:   Physical Exam [id number] Breathing, conversing, and ambulating normally Cuff healed well. I applied silver nitrate to a very small area of granulation tissue Bimanual negative for masses or pain Incision- healed well       Assessment & Plan:  Postop- doing well Dysuria with + leuk est- treat with bactrim ds and check uc&s

## 2016-03-11 LAB — URINE CULTURE: Organism ID, Bacteria: NO GROWTH

## 2016-04-24 ENCOUNTER — Encounter (HOSPITAL_COMMUNITY): Payer: Self-pay

## 2016-07-26 ENCOUNTER — Encounter: Payer: Self-pay | Admitting: Gynecology

## 2016-10-02 ENCOUNTER — Other Ambulatory Visit: Payer: Self-pay | Admitting: Physician Assistant

## 2016-10-02 DIAGNOSIS — R102 Pelvic and perineal pain: Secondary | ICD-10-CM

## 2018-01-16 IMAGING — US US PELVIS COMPLETE
1 series · 14 of 25 positions shown · non-contrast
Comparison: No prior pelvic sonogram.  01/27/2007 MRI pelvis.

CLINICAL DATA: 45-year-old female presents for follow-up of known
uterine fibroids. Patient reports anemia and menorrhagia. LMP
09/26/2015.

EXAM:
TRANSABDOMINAL AND TRANSVAGINAL ULTRASOUND OF PELVIS
TECHNIQUE: Both transabdominal and transvaginal ultrasound examinations of the
pelvis were performed. Transabdominal technique was performed for
global imaging of the pelvis including uterus, ovaries, adnexal
regions, and pelvic cul-de-sac. It was necessary to proceed with
endovaginal exam following the transabdominal exam to visualize the
endometrium, myometrium and adnexa.

[Series 1: us pelvis complete · 14 of 133 slices shown]
[im 1/133]
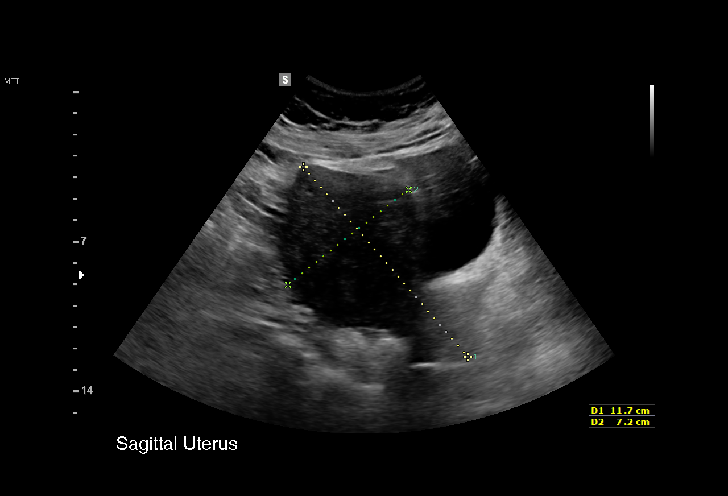
[im 12/133]
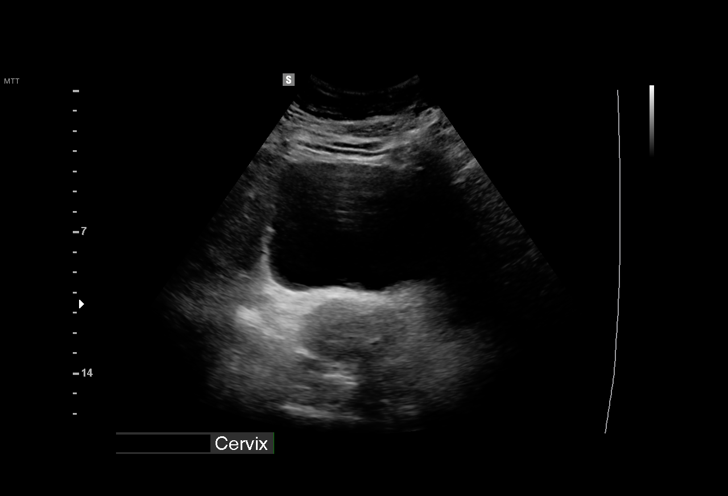
[im 23/133]
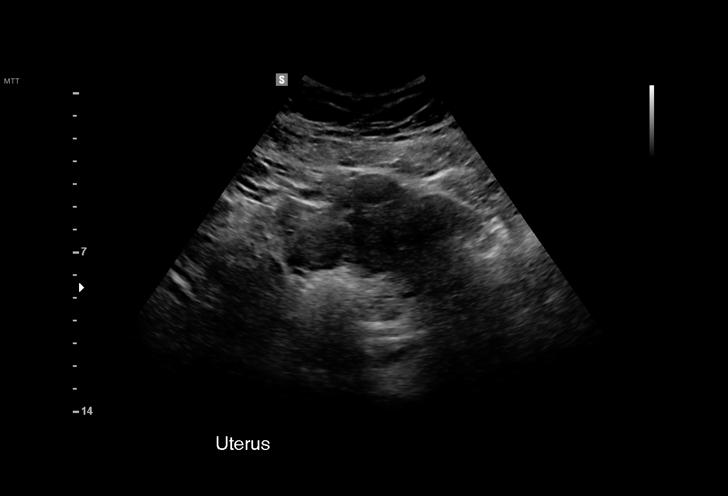
[im 34/133]
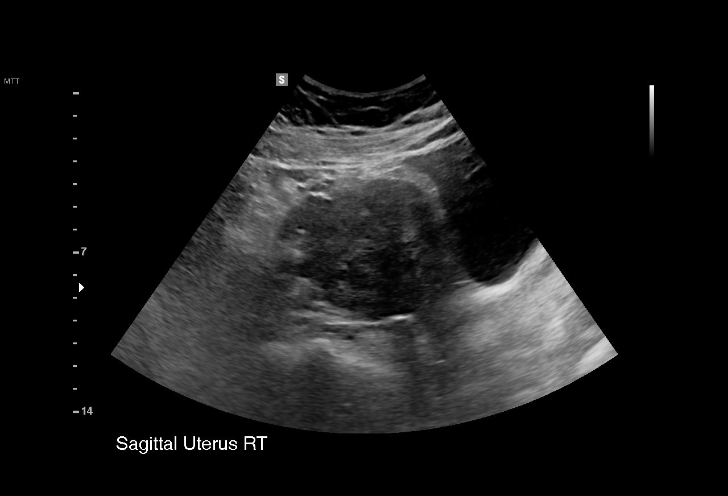
[im 45/133]
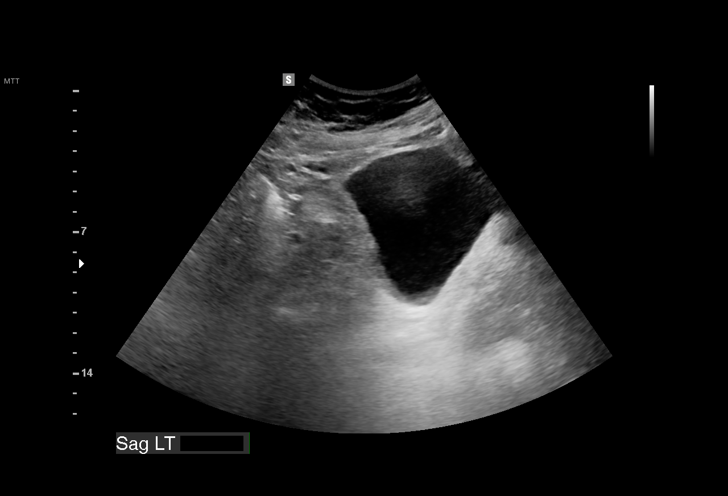
[im 50/133]
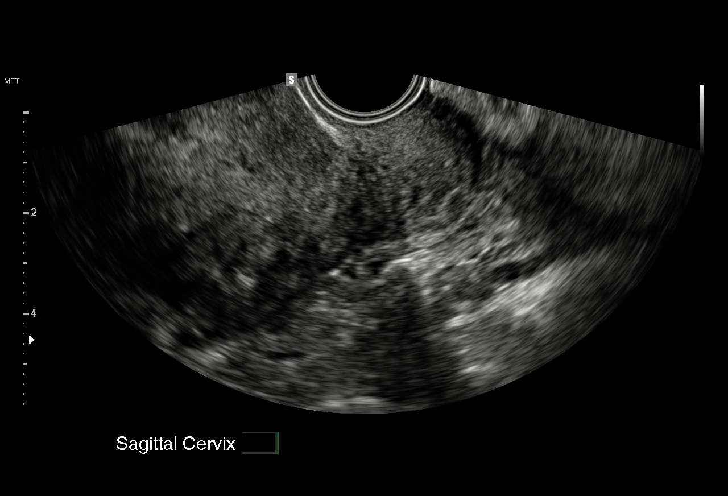
[im 61/133]
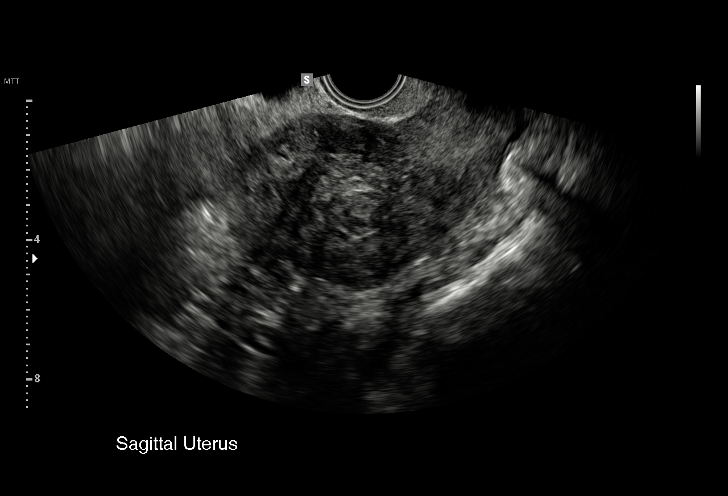
[im 72/133]
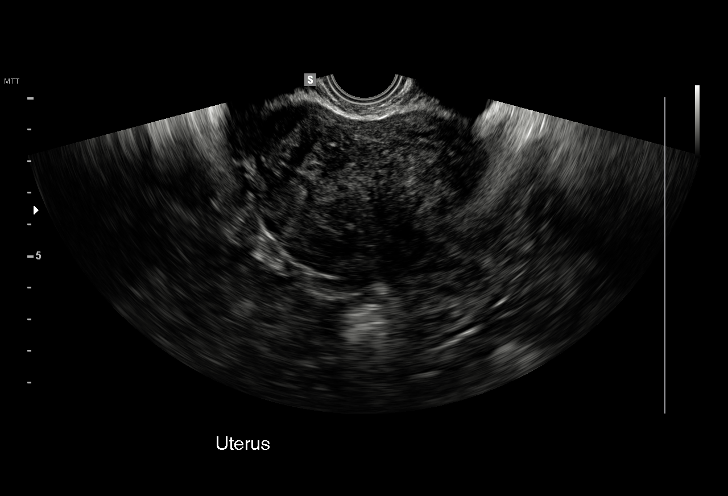
[im 83/133]
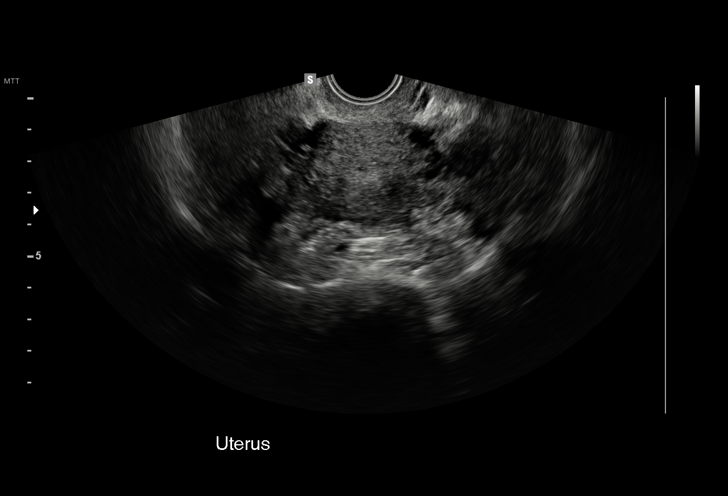
[im 89/133]
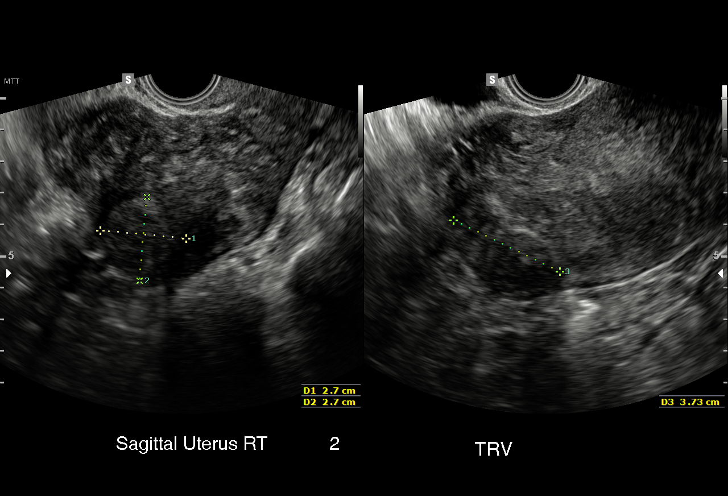
[im 100/133]
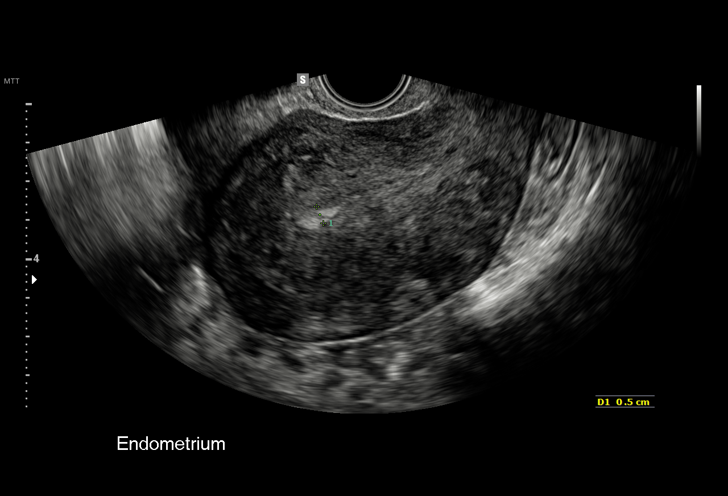
[im 111/133]
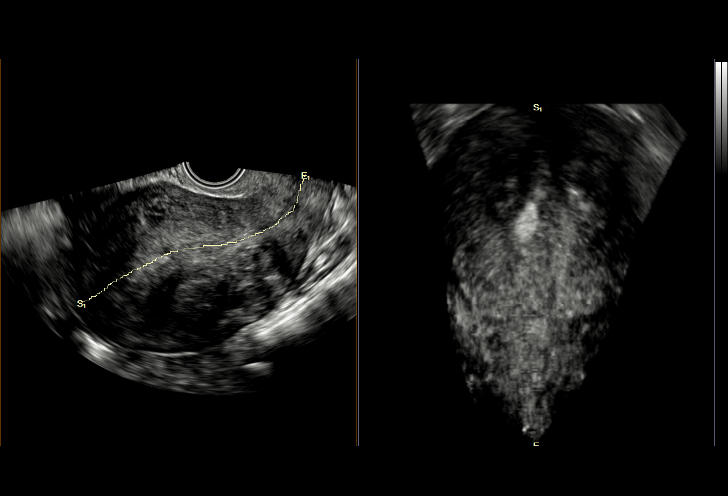
[im 122/133]
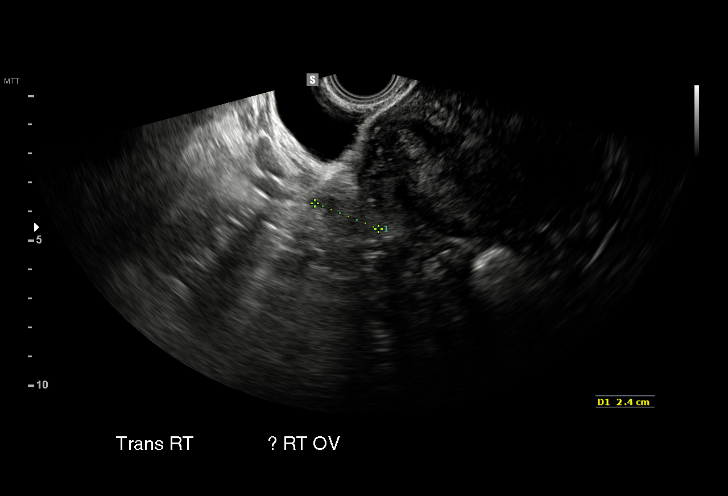
[im 133/133]
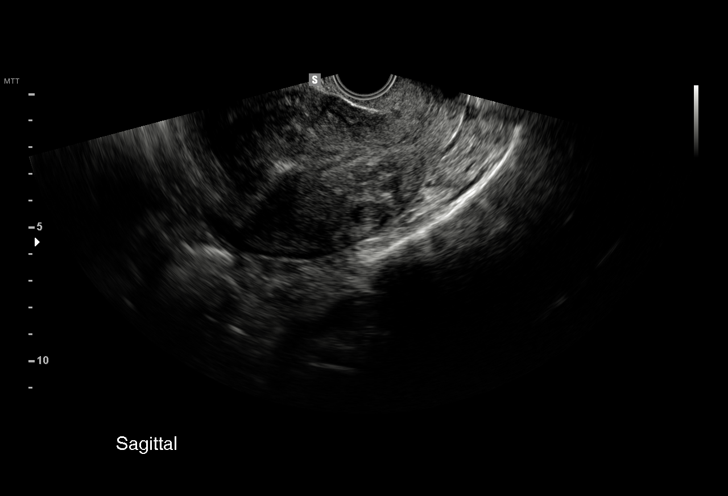

[14 of 25 positions shown; findings below may reference images not displayed]

FINDINGS: Uterus

Measurements: 11.7 x 7.2 x 9.5 cm. The anteverted uterus is
moderately enlarged. There is diffuse heterogeneity of the
myometrium with indistinct endometrial myometrial interface and
scattered tiny myometrial cysts, consistent with underlying diffuse
adenomyosis of the uterus. There are several uterine fibroids, with
representative uterine fibroids as follows:

-left posterior uterine body intramural 3.1 x 3.0 x 3.5 cm fibroid

-right posterior upper uterine body intramural 2.7 x 2.7 x 3.7 cm
fibroid

-right posterior/lateral uterine body subserosal 3.7 x 2.7 x 3.0 cm
fibroid

Endometrium

Thickness: 7 mm. Heterogeneous endometrium. No endometrial cavity
fluid or focal endometrial mass demonstrated.

Right ovary

Measurements: 2.0 x 2.0 x 2.4 cm. Limited visualization of the right
ovary. Candidate right ovary appears grossly normal. No right
ovarian or right adnexal mass demonstrated.

Left ovary

Measurements: 2.6 x 1.3 x 1.3 cm. Normal appearance/no adnexal mass.

Other findings

No abnormal free fluid.
IMPRESSION: 1. Bulky anteverted uterus with uterine fibroids as described and
superimposed diffuse adenomyosis of the uterus.
2. Normal endometrial thickness 7 mm. Heterogeneous endometrium. No
focal endometrial mass or endometrial cavity fluid.
3. No suspicious ovarian or adnexal findings.

## 2021-09-30 ENCOUNTER — Other Ambulatory Visit: Payer: Self-pay

## 2021-09-30 DIAGNOSIS — N644 Mastodynia: Secondary | ICD-10-CM

## 2021-11-08 ENCOUNTER — Ambulatory Visit: Payer: Self-pay

## 2021-11-08 ENCOUNTER — Other Ambulatory Visit: Payer: Self-pay

## 2021-12-29 ENCOUNTER — Ambulatory Visit: Payer: Self-pay

## 2021-12-29 ENCOUNTER — Ambulatory Visit
Admission: RE | Admit: 2021-12-29 | Discharge: 2021-12-29 | Disposition: A | Discharge status: Home / Self Care | Payer: No Typology Code available for payment source | Source: Ambulatory Visit | Attending: Obstetrics and Gynecology | Admitting: Obstetrics and Gynecology

## 2021-12-29 ENCOUNTER — Ambulatory Visit: Payer: Self-pay | Admitting: Hematology and Oncology

## 2021-12-29 VITALS — BP 118/78 | Wt 146.0 lb

## 2021-12-29 DIAGNOSIS — N644 Mastodynia: Secondary | ICD-10-CM

## 2021-12-29 NOTE — Patient Instructions (Signed)
Taught Sonya Gutierrez about breast self awareness. Patient did not need a Pap smear today due to hysterectomy in 2017. Referred patient to the [facility] for diagnostic mammogram. Appointment scheduled for 12/29/21. Patient aware of appointment and will be there. Let patient know will follow up with her within the next couple weeks with results. Sonya Gutierrez verbalized understanding.  Melodye Ped, NP 1:00 PM

## 2021-12-29 NOTE — Progress Notes (Signed)
[name] is a 51 y.o. female who presents to [facility] clinic today with complaint of right breast pain/ "fullness".    Pap Smear: Pap not smear completed today. Hysterectomy in 2017 for benign reasons. No further Pap smears needed.   Physical exam: Breasts Breasts symmetrical. No skin abnormalities bilateral breasts. No nipple retraction bilateral breasts. No nipple discharge bilateral breasts. No lymphadenopathy. No lumps palpated bilateral breasts.       Pelvic/Bimanual Pap is not indicated today    Smoking History: Patient has never smoked and not referred to quit line.    Patient Navigation: Patient education provided. Access to services provided for patient through [place] interpreter provided. No transportation provided   Colorectal Cancer Screening: Per patient has had colonoscopy completed on 2022 and was benign.  No complaints today.    Breast and Cervical Cancer Risk Assessment: Patient does not have family history of breast cancer, known genetic mutations, or radiation treatment to the chest before age 51. Patient does not have history of cervical dysplasia, immunocompromised, or DES exposure in-utero.  Risk Assessment     Risk Scores       12/29/2021   Last edited by: Claretha Cooper, CMA   5-year risk: 0.5 %   Lifetime risk: 4.3 %            A: [facility] exam without pap smear Complaint of right breast pain/ "fullness". Benign exam.   P: Referred patient to the [facility] for a diagnostic mammogram. Appointment scheduled 12/29/21.  Melodye Ped, NP 12/29/2021 12:57 PM

## 2022-11-28 ENCOUNTER — Emergency Department (HOSPITAL_BASED_OUTPATIENT_CLINIC_OR_DEPARTMENT_OTHER)
Admission: EM | Admit: 2022-11-28 | Discharge: 2022-11-28 | Disposition: A | Discharge status: Home / Self Care | Payer: BLUE CROSS/BLUE SHIELD | Attending: Emergency Medicine | Admitting: Emergency Medicine

## 2022-11-28 ENCOUNTER — Other Ambulatory Visit: Payer: Self-pay

## 2022-11-28 ENCOUNTER — Encounter (HOSPITAL_BASED_OUTPATIENT_CLINIC_OR_DEPARTMENT_OTHER): Payer: Self-pay

## 2022-11-28 DIAGNOSIS — M546 Pain in thoracic spine: Secondary | ICD-10-CM | POA: Diagnosis not present

## 2022-11-28 DIAGNOSIS — R110 Nausea: Secondary | ICD-10-CM | POA: Insufficient documentation

## 2022-11-28 DIAGNOSIS — R519 Headache, unspecified: Secondary | ICD-10-CM | POA: Diagnosis present

## 2022-11-28 DIAGNOSIS — H53149 Visual discomfort, unspecified: Secondary | ICD-10-CM | POA: Diagnosis not present

## 2022-11-28 DIAGNOSIS — M545 Low back pain, unspecified: Secondary | ICD-10-CM

## 2022-11-28 MED ORDER — KETOROLAC TROMETHAMINE 30 MG/ML IJ SOLN
15.0000 mg | Freq: Once | INTRAMUSCULAR | Status: AC
  Administered 2022-11-28: 15 mg via INTRAVENOUS
  Filled 2022-11-28: qty 1

## 2022-11-28 MED ORDER — METOCLOPRAMIDE HCL 5 MG/ML IJ SOLN
10.0000 mg | Freq: Once | INTRAMUSCULAR | Status: AC
  Administered 2022-11-28: 10 mg via INTRAVENOUS
  Filled 2022-11-28: qty 2

## 2022-11-28 MED ORDER — DEXAMETHASONE SODIUM PHOSPHATE 10 MG/ML IJ SOLN
10.0000 mg | Freq: Once | INTRAMUSCULAR | Status: AC
  Administered 2022-11-28: 10 mg via INTRAVENOUS
  Filled 2022-11-28: qty 1

## 2022-11-28 NOTE — Discharge Instructions (Signed)
Follow up with your doctor for recheck if symptoms of headache or back pain felt to be musculoskeletal back pain recur.

## 2022-11-28 NOTE — ED Provider Notes (Signed)
[facility] Provider Note   CSN: [id number] Arrival date & time: 11/28/22  1940     History  Chief Complaint  Patient presents with   Headache    Sonya Gutierrez is a 52 y.o. female.  Patient with no significant medical history, including no history of recurrent headaches, presents with constant, sharp, intense right sided headache from frontal area to the neck that started 3 days ago. She has had infrequent mild nausea but no vomiting. She feels the right side of her face is slightly numb but there has been no facial droop or weakness. No trouble walking or feeling off balance. No dizziness. She describes photophobia and phonophobia. No sinus congestion, sore throat or fever. No visual changes. She also reports pain in the lower midline back x 2 weeks. No radiation to the legs. No bowel/bladder dysfunction or saddle anesthesia. She has tried taking ibuprofen and Tylenol without relief.   The history is provided by the patient and a relative. A language interpreter was used (Daughter acting as interpreter).  Headache      Home Medications Prior to Admission medications   Medication Sig Start Date End Date Taking? Authorizing Provider  Cholecalciferol (VITAMIN D3) 1000 units CAPS 1 tab(s) orally once a day for 30 day(s)    [provider]  Collagen-Vitamin C-Biotin (COLLAGEN 1500/C PO) Take by mouth.    [provider]  ibuprofen (ADVIL,MOTRIN) 600 MG tablet Take 1 tablet (600 mg total) by mouth every 6 (six) hours as needed. 01/13/16   Allie Bossier, MD  ibuprofen (ADVIL,MOTRIN) 800 MG tablet Take 1 tablet (800 mg total) by mouth every 8 (eight) hours as needed (mild pain). 01/13/16   Allie Bossier, MD      Allergies    Patient has no known allergies.    Review of Systems   Review of Systems  Neurological:  Positive for headaches.    Physical Exam Updated Vital Signs BP 129/71   Pulse 75   Temp 98 F (36.7 C)  (Oral)   Resp 16   Ht 5\' 1"  (1.549 m)   Wt 68 kg   LMP 12/13/2015 (Exact Date)   SpO2 99%   BMI 28.34 kg/m  Physical Exam Vitals and nursing note reviewed.  Constitutional:      General: She is not in acute distress.    Appearance: She is well-developed. She is not diaphoretic.  HENT:     Head: Normocephalic and atraumatic.     Comments: Mild tenderness to right frontal scalp, otherwise, scalp is nontender.  Eyes:     Extraocular Movements: Extraocular movements intact.     Pupils: Pupils are equal, round, and reactive to light.  Neck:     Vascular: No carotid bruit.     Comments: No paracervical or cervical neck tenderness.  Cardiovascular:     Rate and Rhythm: Normal rate.  Pulmonary:     Effort: Pulmonary effort is normal.  Abdominal:     General: There is no distension.     Palpations: Abdomen is soft.     Tenderness: There is no abdominal tenderness.  Musculoskeletal:        General: No swelling or tenderness. Normal range of motion.     Cervical back: Normal range of motion and neck supple.     Comments: Mild midline lumbar tenderness.   Skin:    General: Skin is warm and dry.  Neurological:     Mental  Status: She is oriented to person, place, and time.     GCS: GCS eye subscore is 4. GCS verbal subscore is 5. GCS motor subscore is 6.     Cranial Nerves: No cranial nerve deficit or dysarthria.     Sensory: No sensory deficit.     Motor: No weakness.     Coordination: Coordination normal.     Deep Tendon Reflexes: Reflexes normal.  Psychiatric:        Mood and Affect: Mood normal.     ED Results / Procedures / Treatments   Labs (all labs ordered are listed, but only abnormal results are displayed) Labs Reviewed - No data to display  EKG None  Radiology No results found.  Procedures Procedures    Medications Ordered in ED Medications  dexamethasone (DECADRON) injection 10 mg (10 mg Intravenous Given 11/28/22 2221)  ketorolac (TORADOL) 30 MG/ML  injection 15 mg (15 mg Intravenous Given 11/28/22 2221)  metoCLOPramide (REGLAN) injection 10 mg (10 mg Intravenous Given 11/28/22 2221)    ED Course/ Medical Decision Making/ A&P Clinical Course as of 11/28/22 2240  Tue Nov 28, 2022  2139 Patient to ED with headache as per HPI, and back pain. No sign or symptom of illness, sinus inflammation. No neurologic abnormality on exam. Normal vital signs. Otherwise healthy. Do not feel CT is indicated. Discussed with Dr. Doran Durand. IV headache cocktail ordered. Will re-evaluate for improvement.  [SU]  2141 Stable Severe headache x3 days. Neurologic exam ok. Treating and reassessessing. [CC]  2237 On re-examination, the patient reports her pain is resolved, both headache and back pain. She is felt appropriate for discharge home. Return precautions discussed. Follow up with PCP is recommended for any recurrent symptoms.  [SU]    Clinical Course User Index [CC] Glyn Ade, MD [SU] Elpidio Anis, PA-C                                 Medical Decision Making Risk Prescription drug management.           Final Clinical Impression(s) / ED Diagnoses Final diagnoses:  Acute nonintractable headache, unspecified headache type  Acute midline low back pain without sciatica    Rx / DC Orders ED Discharge Orders     None         Elpidio Anis, PA-C 11/28/22 2240    Glyn Ade, MD 11/28/22 [phone]

## 2022-11-28 NOTE — ED Triage Notes (Signed)
Pt c/o right sided headache x 3 days, denies numbness, states it feels more comfortable when she applies pressure to same side. Endorses slight nausea but denies vomiting or dizziness.
